# Patient Record
Sex: Female | Born: 1935
Health system: Southern US, Community
[De-identification: ages and names within clinical notes are randomized; demographics above are authoritative.]

## PROBLEM LIST (undated history)

## (undated) DIAGNOSIS — G2581 Restless legs syndrome: Secondary | ICD-10-CM

## (undated) DIAGNOSIS — D696 Thrombocytopenia, unspecified: Secondary | ICD-10-CM

## (undated) DIAGNOSIS — I10 Essential (primary) hypertension: Secondary | ICD-10-CM

## (undated) DIAGNOSIS — Z889 Allergy status to unspecified drugs, medicaments and biological substances status: Secondary | ICD-10-CM

## (undated) DIAGNOSIS — I493 Ventricular premature depolarization: Secondary | ICD-10-CM

## (undated) DIAGNOSIS — E559 Vitamin D deficiency, unspecified: Secondary | ICD-10-CM

## (undated) DIAGNOSIS — R7303 Prediabetes: Secondary | ICD-10-CM

## (undated) DIAGNOSIS — M1990 Unspecified osteoarthritis, unspecified site: Secondary | ICD-10-CM

## (undated) DIAGNOSIS — G4700 Insomnia, unspecified: Secondary | ICD-10-CM

## (undated) DIAGNOSIS — F419 Anxiety disorder, unspecified: Secondary | ICD-10-CM

## (undated) DIAGNOSIS — R42 Dizziness and giddiness: Secondary | ICD-10-CM

## (undated) DIAGNOSIS — H409 Unspecified glaucoma: Secondary | ICD-10-CM

## (undated) DIAGNOSIS — F32A Depression, unspecified: Secondary | ICD-10-CM

## (undated) DIAGNOSIS — F410 Panic disorder [episodic paroxysmal anxiety] without agoraphobia: Secondary | ICD-10-CM

## (undated) DIAGNOSIS — R202 Paresthesia of skin: Secondary | ICD-10-CM

## (undated) DIAGNOSIS — M810 Age-related osteoporosis without current pathological fracture: Secondary | ICD-10-CM

## (undated) DIAGNOSIS — M8580 Other specified disorders of bone density and structure, unspecified site: Secondary | ICD-10-CM

## (undated) DIAGNOSIS — K635 Polyp of colon: Secondary | ICD-10-CM

## (undated) DIAGNOSIS — E785 Hyperlipidemia, unspecified: Secondary | ICD-10-CM

## (undated) DIAGNOSIS — K219 Gastro-esophageal reflux disease without esophagitis: Secondary | ICD-10-CM

## (undated) HISTORY — DX: Thrombocytopenia, unspecified: D69.6

## (undated) HISTORY — DX: Panic disorder (episodic paroxysmal anxiety): F41.0

## (undated) HISTORY — DX: Other specified disorders of bone density and structure, unspecified site: M85.80

## (undated) HISTORY — DX: Vitamin D deficiency, unspecified: E55.9

## (undated) HISTORY — DX: Essential (primary) hypertension: I10

## (undated) HISTORY — DX: Insomnia, unspecified: G47.00

## (undated) HISTORY — DX: Restless legs syndrome: G25.81

## (undated) HISTORY — DX: Allergy status to unspecified drugs, medicaments and biological substances: Z88.9

## (undated) HISTORY — PX: TUBAL LIGATION: SHX77

## (undated) HISTORY — DX: Depression, unspecified: F32.A

## (undated) HISTORY — DX: Gastro-esophageal reflux disease without esophagitis: K21.9

## (undated) HISTORY — PX: APPENDECTOMY: SHX54

## (undated) HISTORY — DX: Anxiety disorder, unspecified: F41.9

## (undated) HISTORY — DX: Polyp of colon: K63.5

## (undated) HISTORY — DX: Dizziness and giddiness: R42

## (undated) HISTORY — PX: OTHER SURGICAL HISTORY: SHX169

## (undated) HISTORY — PX: VAGINAL HYSTERECTOMY: SUR661

## (undated) HISTORY — DX: Ventricular premature depolarization: I49.3

## (undated) HISTORY — DX: Paresthesia of skin: R20.2

## (undated) HISTORY — DX: Unspecified osteoarthritis, unspecified site: M19.90

## (undated) HISTORY — DX: Hyperlipidemia, unspecified: E78.5

## (undated) HISTORY — DX: Prediabetes: R73.03

## (undated) HISTORY — DX: Unspecified glaucoma: H40.9

## (undated) HISTORY — PX: TONSILLECTOMY AND ADENOIDECTOMY: SUR1326

## (undated) HISTORY — PX: FOOT SURGERY: SHX648

## (undated) HISTORY — PX: DILATION AND CURETTAGE OF UTERUS: SHX78

## (undated) HISTORY — DX: Age-related osteoporosis without current pathological fracture: M81.0

## (undated) HISTORY — PX: CATARACT EXTRACTION, BILATERAL: SHX1313

---

## 1998-05-23 ENCOUNTER — Encounter: Payer: Self-pay | Admitting: Family Medicine

## 1998-05-23 ENCOUNTER — Ambulatory Visit (HOSPITAL_COMMUNITY): Admission: RE | Admit: 1998-05-23 | Discharge: 1998-05-23 | Payer: Self-pay | Admitting: Family Medicine

## 1998-06-19 ENCOUNTER — Ambulatory Visit (HOSPITAL_COMMUNITY): Admission: RE | Admit: 1998-06-19 | Discharge: 1998-06-19 | Payer: Self-pay | Admitting: Gastroenterology

## 1998-06-19 ENCOUNTER — Encounter: Payer: Self-pay | Admitting: Gastroenterology

## 1998-11-04 ENCOUNTER — Encounter: Payer: Self-pay | Admitting: Family Medicine

## 1998-11-04 ENCOUNTER — Ambulatory Visit (HOSPITAL_COMMUNITY): Admission: RE | Admit: 1998-11-04 | Discharge: 1998-11-04 | Payer: Self-pay | Admitting: Family Medicine

## 1998-12-12 ENCOUNTER — Encounter: Payer: Self-pay | Admitting: Family Medicine

## 1998-12-12 ENCOUNTER — Ambulatory Visit (HOSPITAL_COMMUNITY): Admission: RE | Admit: 1998-12-12 | Discharge: 1998-12-12 | Payer: Self-pay | Admitting: Family Medicine

## 1999-07-20 ENCOUNTER — Ambulatory Visit (HOSPITAL_COMMUNITY): Admission: RE | Admit: 1999-07-20 | Discharge: 1999-07-20 | Payer: Self-pay | Admitting: Family Medicine

## 1999-07-20 ENCOUNTER — Encounter: Payer: Self-pay | Admitting: Family Medicine

## 2000-08-24 ENCOUNTER — Encounter: Payer: Self-pay | Admitting: Family Medicine

## 2000-08-24 ENCOUNTER — Ambulatory Visit (HOSPITAL_COMMUNITY): Admission: RE | Admit: 2000-08-24 | Discharge: 2000-08-24 | Payer: Self-pay | Admitting: Family Medicine

## 2001-09-12 ENCOUNTER — Ambulatory Visit (HOSPITAL_COMMUNITY): Admission: RE | Admit: 2001-09-12 | Discharge: 2001-09-12 | Payer: Self-pay | Admitting: Family Medicine

## 2001-09-12 ENCOUNTER — Encounter: Payer: Self-pay | Admitting: Family Medicine

## 2001-10-10 ENCOUNTER — Encounter: Payer: Self-pay | Admitting: Family Medicine

## 2001-10-10 ENCOUNTER — Ambulatory Visit (HOSPITAL_COMMUNITY): Admission: RE | Admit: 2001-10-10 | Discharge: 2001-10-10 | Payer: Self-pay | Admitting: Family Medicine

## 2002-09-19 ENCOUNTER — Encounter: Payer: Self-pay | Admitting: Family Medicine

## 2002-09-19 ENCOUNTER — Ambulatory Visit (HOSPITAL_COMMUNITY): Admission: RE | Admit: 2002-09-19 | Discharge: 2002-09-19 | Payer: Self-pay | Admitting: Family Medicine

## 2003-10-03 ENCOUNTER — Ambulatory Visit (HOSPITAL_COMMUNITY): Admission: RE | Admit: 2003-10-03 | Discharge: 2003-10-03 | Payer: Self-pay | Admitting: Family Medicine

## 2004-01-14 ENCOUNTER — Ambulatory Visit (HOSPITAL_COMMUNITY): Admission: RE | Admit: 2004-01-14 | Discharge: 2004-01-14 | Payer: Self-pay | Admitting: Family Medicine

## 2004-07-22 ENCOUNTER — Other Ambulatory Visit: Admission: RE | Admit: 2004-07-22 | Discharge: 2004-07-22 | Payer: Self-pay | Admitting: Family Medicine

## 2004-11-04 ENCOUNTER — Ambulatory Visit (HOSPITAL_COMMUNITY): Admission: RE | Admit: 2004-11-04 | Discharge: 2004-11-04 | Payer: Self-pay | Admitting: Family Medicine

## 2005-07-28 ENCOUNTER — Other Ambulatory Visit: Admission: RE | Admit: 2005-07-28 | Discharge: 2005-07-28 | Payer: Self-pay | Admitting: Family Medicine

## 2005-11-08 ENCOUNTER — Ambulatory Visit (HOSPITAL_COMMUNITY): Admission: RE | Admit: 2005-11-08 | Discharge: 2005-11-08 | Payer: Self-pay | Admitting: Family Medicine

## 2006-08-01 ENCOUNTER — Other Ambulatory Visit: Admission: RE | Admit: 2006-08-01 | Discharge: 2006-08-01 | Payer: Self-pay | Admitting: Family Medicine

## 2006-11-29 ENCOUNTER — Ambulatory Visit (HOSPITAL_COMMUNITY): Admission: RE | Admit: 2006-11-29 | Discharge: 2006-11-29 | Payer: Self-pay | Admitting: Family Medicine

## 2007-12-01 ENCOUNTER — Ambulatory Visit (HOSPITAL_COMMUNITY): Admission: RE | Admit: 2007-12-01 | Discharge: 2007-12-01 | Payer: Self-pay | Admitting: Family Medicine

## 2008-12-02 ENCOUNTER — Ambulatory Visit (HOSPITAL_COMMUNITY): Admission: RE | Admit: 2008-12-02 | Discharge: 2008-12-02 | Payer: Self-pay | Admitting: Family Medicine

## 2009-03-27 ENCOUNTER — Encounter: Admission: RE | Admit: 2009-03-27 | Discharge: 2009-03-27 | Payer: Self-pay | Admitting: Family Medicine

## 2009-12-03 ENCOUNTER — Ambulatory Visit (HOSPITAL_COMMUNITY): Admission: RE | Admit: 2009-12-03 | Discharge: 2009-12-03 | Payer: Self-pay | Admitting: Family Medicine

## 2010-11-05 ENCOUNTER — Other Ambulatory Visit (HOSPITAL_COMMUNITY): Payer: Self-pay | Admitting: Family Medicine

## 2010-11-05 DIAGNOSIS — Z1231 Encounter for screening mammogram for malignant neoplasm of breast: Secondary | ICD-10-CM

## 2010-12-23 ENCOUNTER — Ambulatory Visit (HOSPITAL_COMMUNITY)
Admission: RE | Admit: 2010-12-23 | Discharge: 2010-12-23 | Disposition: A | Discharge status: Home / Self Care | Payer: Medicare Other | Source: Ambulatory Visit | Attending: Family Medicine | Admitting: Family Medicine

## 2010-12-23 DIAGNOSIS — Z1231 Encounter for screening mammogram for malignant neoplasm of breast: Secondary | ICD-10-CM

## 2010-12-28 ENCOUNTER — Other Ambulatory Visit: Payer: Self-pay | Admitting: Family Medicine

## 2010-12-28 DIAGNOSIS — R928 Other abnormal and inconclusive findings on diagnostic imaging of breast: Secondary | ICD-10-CM

## 2011-01-04 ENCOUNTER — Ambulatory Visit
Admission: RE | Admit: 2011-01-04 | Discharge: 2011-01-04 | Disposition: A | Discharge status: Home / Self Care | Payer: Medicare Other | Source: Ambulatory Visit | Attending: Family Medicine | Admitting: Family Medicine

## 2011-01-04 DIAGNOSIS — R928 Other abnormal and inconclusive findings on diagnostic imaging of breast: Secondary | ICD-10-CM

## 2011-07-07 ENCOUNTER — Other Ambulatory Visit: Payer: Self-pay | Admitting: Diagnostic Neuroimaging

## 2011-07-07 DIAGNOSIS — R42 Dizziness and giddiness: Secondary | ICD-10-CM

## 2011-07-15 ENCOUNTER — Ambulatory Visit
Admission: RE | Admit: 2011-07-15 | Discharge: 2011-07-15 | Disposition: A | Discharge status: Home / Self Care | Payer: Medicare Other | Source: Ambulatory Visit | Attending: Diagnostic Neuroimaging | Admitting: Diagnostic Neuroimaging

## 2011-07-15 DIAGNOSIS — R42 Dizziness and giddiness: Secondary | ICD-10-CM

## 2011-07-15 MED ORDER — GADOBENATE DIMEGLUMINE 529 MG/ML IV SOLN
14.0000 mL | Freq: Once | INTRAVENOUS | Status: AC | PRN
  Administered 2011-07-15: 14 mL via INTRAVENOUS

## 2011-07-22 ENCOUNTER — Other Ambulatory Visit: Payer: Self-pay | Admitting: Diagnostic Neuroimaging

## 2011-07-22 DIAGNOSIS — R42 Dizziness and giddiness: Secondary | ICD-10-CM

## 2011-07-23 ENCOUNTER — Ambulatory Visit
Admission: RE | Admit: 2011-07-23 | Discharge: 2011-07-23 | Disposition: A | Discharge status: Home / Self Care | Payer: Medicare Other | Source: Ambulatory Visit | Attending: Diagnostic Neuroimaging | Admitting: Diagnostic Neuroimaging

## 2011-07-23 DIAGNOSIS — R42 Dizziness and giddiness: Secondary | ICD-10-CM

## 2011-07-24 ENCOUNTER — Other Ambulatory Visit: Payer: Medicare Other

## 2011-07-25 ENCOUNTER — Other Ambulatory Visit: Payer: Medicare Other

## 2012-01-25 ENCOUNTER — Other Ambulatory Visit: Payer: Self-pay | Admitting: Family Medicine

## 2012-01-25 DIAGNOSIS — Z1231 Encounter for screening mammogram for malignant neoplasm of breast: Secondary | ICD-10-CM

## 2012-02-09 ENCOUNTER — Ambulatory Visit
Admission: RE | Admit: 2012-02-09 | Discharge: 2012-02-09 | Disposition: A | Discharge status: Home / Self Care | Payer: Medicare Other | Source: Ambulatory Visit | Attending: Family Medicine | Admitting: Family Medicine

## 2012-02-09 DIAGNOSIS — Z1231 Encounter for screening mammogram for malignant neoplasm of breast: Secondary | ICD-10-CM

## 2013-01-31 ENCOUNTER — Other Ambulatory Visit: Payer: Self-pay

## 2013-01-31 DIAGNOSIS — Z1231 Encounter for screening mammogram for malignant neoplasm of breast: Secondary | ICD-10-CM

## 2013-02-15 ENCOUNTER — Ambulatory Visit
Admission: RE | Admit: 2013-02-15 | Discharge: 2013-02-15 | Disposition: A | Discharge status: Home / Self Care | Payer: Medicare Other | Source: Ambulatory Visit

## 2013-02-15 DIAGNOSIS — Z1231 Encounter for screening mammogram for malignant neoplasm of breast: Secondary | ICD-10-CM

## 2013-02-19 ENCOUNTER — Other Ambulatory Visit: Payer: Self-pay | Admitting: Family Medicine

## 2013-02-19 DIAGNOSIS — R928 Other abnormal and inconclusive findings on diagnostic imaging of breast: Secondary | ICD-10-CM

## 2013-02-21 ENCOUNTER — Ambulatory Visit
Admission: RE | Admit: 2013-02-21 | Discharge: 2013-02-21 | Disposition: A | Discharge status: Home / Self Care | Payer: Medicare Other | Source: Ambulatory Visit | Attending: Family Medicine | Admitting: Family Medicine

## 2013-02-21 DIAGNOSIS — R928 Other abnormal and inconclusive findings on diagnostic imaging of breast: Secondary | ICD-10-CM

## 2013-02-23 ENCOUNTER — Other Ambulatory Visit: Payer: Medicare Other

## 2013-02-26 ENCOUNTER — Ambulatory Visit
Admission: RE | Admit: 2013-02-26 | Discharge: 2013-02-26 | Disposition: A | Discharge status: Home / Self Care | Payer: Medicare Other | Source: Ambulatory Visit | Attending: Family Medicine | Admitting: Family Medicine

## 2013-02-26 ENCOUNTER — Other Ambulatory Visit: Payer: Self-pay | Admitting: Family Medicine

## 2013-02-26 DIAGNOSIS — M25562 Pain in left knee: Secondary | ICD-10-CM

## 2013-07-17 ENCOUNTER — Other Ambulatory Visit: Payer: Self-pay | Admitting: Family Medicine

## 2013-07-17 DIAGNOSIS — N6009 Solitary cyst of unspecified breast: Secondary | ICD-10-CM

## 2013-07-18 ENCOUNTER — Other Ambulatory Visit: Payer: Self-pay | Admitting: Orthopaedic Surgery

## 2013-07-18 DIAGNOSIS — M25551 Pain in right hip: Secondary | ICD-10-CM

## 2013-07-27 ENCOUNTER — Ambulatory Visit
Admission: RE | Admit: 2013-07-27 | Discharge: 2013-07-27 | Disposition: A | Discharge status: Home / Self Care | Payer: Medicare Other | Source: Ambulatory Visit | Attending: Orthopaedic Surgery | Admitting: Orthopaedic Surgery

## 2013-07-27 DIAGNOSIS — M25551 Pain in right hip: Secondary | ICD-10-CM

## 2013-08-23 ENCOUNTER — Ambulatory Visit
Admission: RE | Admit: 2013-08-23 | Discharge: 2013-08-23 | Disposition: A | Discharge status: Home / Self Care | Payer: Medicare Other | Source: Ambulatory Visit | Attending: Family Medicine | Admitting: Family Medicine

## 2013-08-23 DIAGNOSIS — N6009 Solitary cyst of unspecified breast: Secondary | ICD-10-CM

## 2014-01-11 ENCOUNTER — Other Ambulatory Visit: Payer: Self-pay

## 2014-01-11 DIAGNOSIS — Z1231 Encounter for screening mammogram for malignant neoplasm of breast: Secondary | ICD-10-CM

## 2014-02-20 ENCOUNTER — Ambulatory Visit
Admission: RE | Admit: 2014-02-20 | Discharge: 2014-02-20 | Disposition: A | Discharge status: Home / Self Care | Payer: Medicare Other | Source: Ambulatory Visit

## 2014-02-20 ENCOUNTER — Encounter (INDEPENDENT_AMBULATORY_CARE_PROVIDER_SITE_OTHER): Payer: Self-pay

## 2014-02-20 DIAGNOSIS — Z1231 Encounter for screening mammogram for malignant neoplasm of breast: Secondary | ICD-10-CM

## 2015-01-20 ENCOUNTER — Other Ambulatory Visit: Payer: Self-pay

## 2015-01-20 DIAGNOSIS — Z1231 Encounter for screening mammogram for malignant neoplasm of breast: Secondary | ICD-10-CM

## 2015-02-26 ENCOUNTER — Ambulatory Visit
Admission: RE | Admit: 2015-02-26 | Discharge: 2015-02-26 | Disposition: A | Discharge status: Home / Self Care | Payer: Medicare Other | Source: Ambulatory Visit

## 2015-02-26 DIAGNOSIS — Z1231 Encounter for screening mammogram for malignant neoplasm of breast: Secondary | ICD-10-CM

## 2015-07-31 DIAGNOSIS — R739 Hyperglycemia, unspecified: Secondary | ICD-10-CM | POA: Diagnosis not present

## 2015-07-31 DIAGNOSIS — E7800 Pure hypercholesterolemia, unspecified: Secondary | ICD-10-CM | POA: Diagnosis not present

## 2015-07-31 DIAGNOSIS — K219 Gastro-esophageal reflux disease without esophagitis: Secondary | ICD-10-CM | POA: Diagnosis not present

## 2015-07-31 DIAGNOSIS — I10 Essential (primary) hypertension: Secondary | ICD-10-CM | POA: Diagnosis not present

## 2015-07-31 DIAGNOSIS — M62838 Other muscle spasm: Secondary | ICD-10-CM | POA: Diagnosis not present

## 2015-07-31 DIAGNOSIS — G4700 Insomnia, unspecified: Secondary | ICD-10-CM | POA: Diagnosis not present

## 2015-08-25 DIAGNOSIS — H401212 Low-tension glaucoma, right eye, moderate stage: Secondary | ICD-10-CM | POA: Diagnosis not present

## 2015-08-25 DIAGNOSIS — H01002 Unspecified blepharitis right lower eyelid: Secondary | ICD-10-CM | POA: Diagnosis not present

## 2015-08-25 DIAGNOSIS — H43813 Vitreous degeneration, bilateral: Secondary | ICD-10-CM | POA: Diagnosis not present

## 2015-08-25 DIAGNOSIS — H401224 Low-tension glaucoma, left eye, indeterminate stage: Secondary | ICD-10-CM | POA: Diagnosis not present

## 2015-11-04 DIAGNOSIS — R202 Paresthesia of skin: Secondary | ICD-10-CM | POA: Diagnosis not present

## 2015-11-05 ENCOUNTER — Other Ambulatory Visit: Payer: Self-pay | Admitting: Family Medicine

## 2015-11-05 DIAGNOSIS — R202 Paresthesia of skin: Secondary | ICD-10-CM

## 2015-11-10 ENCOUNTER — Ambulatory Visit
Admission: RE | Admit: 2015-11-10 | Discharge: 2015-11-10 | Disposition: A | Discharge status: Home / Self Care | Payer: Medicare Other | Source: Ambulatory Visit | Attending: Family Medicine | Admitting: Family Medicine

## 2015-11-10 DIAGNOSIS — R202 Paresthesia of skin: Secondary | ICD-10-CM

## 2015-11-10 DIAGNOSIS — R42 Dizziness and giddiness: Secondary | ICD-10-CM | POA: Diagnosis not present

## 2016-01-16 ENCOUNTER — Other Ambulatory Visit: Payer: Self-pay | Admitting: Family Medicine

## 2016-01-16 DIAGNOSIS — Z1231 Encounter for screening mammogram for malignant neoplasm of breast: Secondary | ICD-10-CM

## 2016-01-29 DIAGNOSIS — I10 Essential (primary) hypertension: Secondary | ICD-10-CM | POA: Diagnosis not present

## 2016-01-29 DIAGNOSIS — E559 Vitamin D deficiency, unspecified: Secondary | ICD-10-CM | POA: Diagnosis not present

## 2016-01-29 DIAGNOSIS — Z0000 Encounter for general adult medical examination without abnormal findings: Secondary | ICD-10-CM | POA: Diagnosis not present

## 2016-01-29 DIAGNOSIS — D696 Thrombocytopenia, unspecified: Secondary | ICD-10-CM | POA: Diagnosis not present

## 2016-01-29 DIAGNOSIS — K219 Gastro-esophageal reflux disease without esophagitis: Secondary | ICD-10-CM | POA: Diagnosis not present

## 2016-01-29 DIAGNOSIS — K5900 Constipation, unspecified: Secondary | ICD-10-CM | POA: Diagnosis not present

## 2016-01-29 DIAGNOSIS — R7303 Prediabetes: Secondary | ICD-10-CM | POA: Diagnosis not present

## 2016-01-29 DIAGNOSIS — G4700 Insomnia, unspecified: Secondary | ICD-10-CM | POA: Diagnosis not present

## 2016-01-29 DIAGNOSIS — R0789 Other chest pain: Secondary | ICD-10-CM | POA: Diagnosis not present

## 2016-01-29 DIAGNOSIS — E7800 Pure hypercholesterolemia, unspecified: Secondary | ICD-10-CM | POA: Diagnosis not present

## 2016-02-18 DIAGNOSIS — Z808 Family history of malignant neoplasm of other organs or systems: Secondary | ICD-10-CM | POA: Diagnosis not present

## 2016-02-18 DIAGNOSIS — L821 Other seborrheic keratosis: Secondary | ICD-10-CM | POA: Diagnosis not present

## 2016-02-18 DIAGNOSIS — L708 Other acne: Secondary | ICD-10-CM | POA: Diagnosis not present

## 2016-02-18 DIAGNOSIS — D2230 Melanocytic nevi of unspecified part of face: Secondary | ICD-10-CM | POA: Diagnosis not present

## 2016-02-18 DIAGNOSIS — L570 Actinic keratosis: Secondary | ICD-10-CM | POA: Diagnosis not present

## 2016-02-25 DIAGNOSIS — H401221 Low-tension glaucoma, left eye, mild stage: Secondary | ICD-10-CM | POA: Diagnosis not present

## 2016-02-25 DIAGNOSIS — H401212 Low-tension glaucoma, right eye, moderate stage: Secondary | ICD-10-CM | POA: Diagnosis not present

## 2016-03-02 ENCOUNTER — Ambulatory Visit
Admission: RE | Admit: 2016-03-02 | Discharge: 2016-03-02 | Disposition: A | Discharge status: Home / Self Care | Payer: Medicare Other | Source: Ambulatory Visit | Attending: Family Medicine | Admitting: Family Medicine

## 2016-03-02 DIAGNOSIS — Z1231 Encounter for screening mammogram for malignant neoplasm of breast: Secondary | ICD-10-CM | POA: Diagnosis not present

## 2016-05-21 DIAGNOSIS — B9789 Other viral agents as the cause of diseases classified elsewhere: Secondary | ICD-10-CM | POA: Diagnosis not present

## 2016-05-21 DIAGNOSIS — J069 Acute upper respiratory infection, unspecified: Secondary | ICD-10-CM | POA: Diagnosis not present

## 2016-07-12 DIAGNOSIS — E559 Vitamin D deficiency, unspecified: Secondary | ICD-10-CM | POA: Diagnosis not present

## 2016-07-12 DIAGNOSIS — G4489 Other headache syndrome: Secondary | ICD-10-CM | POA: Diagnosis not present

## 2016-07-12 DIAGNOSIS — K219 Gastro-esophageal reflux disease without esophagitis: Secondary | ICD-10-CM | POA: Diagnosis not present

## 2016-07-12 DIAGNOSIS — I10 Essential (primary) hypertension: Secondary | ICD-10-CM | POA: Diagnosis not present

## 2016-07-30 DIAGNOSIS — E7800 Pure hypercholesterolemia, unspecified: Secondary | ICD-10-CM | POA: Diagnosis not present

## 2016-07-30 DIAGNOSIS — K219 Gastro-esophageal reflux disease without esophagitis: Secondary | ICD-10-CM | POA: Diagnosis not present

## 2016-07-30 DIAGNOSIS — R7303 Prediabetes: Secondary | ICD-10-CM | POA: Diagnosis not present

## 2016-07-30 DIAGNOSIS — D696 Thrombocytopenia, unspecified: Secondary | ICD-10-CM | POA: Diagnosis not present

## 2016-09-17 DIAGNOSIS — H401221 Low-tension glaucoma, left eye, mild stage: Secondary | ICD-10-CM | POA: Diagnosis not present

## 2016-09-17 DIAGNOSIS — H401212 Low-tension glaucoma, right eye, moderate stage: Secondary | ICD-10-CM | POA: Diagnosis not present

## 2016-09-17 DIAGNOSIS — H04123 Dry eye syndrome of bilateral lacrimal glands: Secondary | ICD-10-CM | POA: Diagnosis not present

## 2016-10-05 DIAGNOSIS — H01004 Unspecified blepharitis left upper eyelid: Secondary | ICD-10-CM | POA: Diagnosis not present

## 2016-10-05 DIAGNOSIS — H02055 Trichiasis without entropian left lower eyelid: Secondary | ICD-10-CM | POA: Diagnosis not present

## 2016-10-05 DIAGNOSIS — H01005 Unspecified blepharitis left lower eyelid: Secondary | ICD-10-CM | POA: Diagnosis not present

## 2016-10-14 DIAGNOSIS — S82842A Displaced bimalleolar fracture of left lower leg, initial encounter for closed fracture: Secondary | ICD-10-CM | POA: Diagnosis not present

## 2016-10-14 DIAGNOSIS — S82841A Displaced bimalleolar fracture of right lower leg, initial encounter for closed fracture: Secondary | ICD-10-CM | POA: Diagnosis not present

## 2016-10-14 DIAGNOSIS — M25572 Pain in left ankle and joints of left foot: Secondary | ICD-10-CM | POA: Diagnosis not present

## 2016-10-14 DIAGNOSIS — M1811 Unilateral primary osteoarthritis of first carpometacarpal joint, right hand: Secondary | ICD-10-CM | POA: Diagnosis not present

## 2016-10-15 DIAGNOSIS — G8918 Other acute postprocedural pain: Secondary | ICD-10-CM | POA: Diagnosis not present

## 2016-10-15 DIAGNOSIS — S82852A Displaced trimalleolar fracture of left lower leg, initial encounter for closed fracture: Secondary | ICD-10-CM | POA: Diagnosis not present

## 2016-10-26 DIAGNOSIS — M1611 Unilateral primary osteoarthritis, right hip: Secondary | ICD-10-CM | POA: Diagnosis not present

## 2016-10-26 DIAGNOSIS — Z9181 History of falling: Secondary | ICD-10-CM | POA: Diagnosis not present

## 2016-10-26 DIAGNOSIS — M810 Age-related osteoporosis without current pathological fracture: Secondary | ICD-10-CM | POA: Diagnosis not present

## 2016-10-26 DIAGNOSIS — M1811 Unilateral primary osteoarthritis of first carpometacarpal joint, right hand: Secondary | ICD-10-CM | POA: Diagnosis not present

## 2016-10-26 DIAGNOSIS — S82852D Displaced trimalleolar fracture of left lower leg, subsequent encounter for closed fracture with routine healing: Secondary | ICD-10-CM | POA: Diagnosis not present

## 2016-10-28 DIAGNOSIS — S82852D Displaced trimalleolar fracture of left lower leg, subsequent encounter for closed fracture with routine healing: Secondary | ICD-10-CM | POA: Diagnosis not present

## 2016-11-09 DIAGNOSIS — M8580 Other specified disorders of bone density and structure, unspecified site: Secondary | ICD-10-CM | POA: Diagnosis not present

## 2016-11-09 DIAGNOSIS — M169 Osteoarthritis of hip, unspecified: Secondary | ICD-10-CM | POA: Diagnosis not present

## 2016-11-18 DIAGNOSIS — S82852D Displaced trimalleolar fracture of left lower leg, subsequent encounter for closed fracture with routine healing: Secondary | ICD-10-CM | POA: Diagnosis not present

## 2016-11-30 DIAGNOSIS — S82852D Displaced trimalleolar fracture of left lower leg, subsequent encounter for closed fracture with routine healing: Secondary | ICD-10-CM | POA: Diagnosis not present

## 2016-12-09 DIAGNOSIS — M8580 Other specified disorders of bone density and structure, unspecified site: Secondary | ICD-10-CM | POA: Diagnosis not present

## 2016-12-09 DIAGNOSIS — M169 Osteoarthritis of hip, unspecified: Secondary | ICD-10-CM | POA: Diagnosis not present

## 2016-12-14 DIAGNOSIS — M6281 Muscle weakness (generalized): Secondary | ICD-10-CM | POA: Diagnosis not present

## 2016-12-14 DIAGNOSIS — H8110 Benign paroxysmal vertigo, unspecified ear: Secondary | ICD-10-CM | POA: Diagnosis not present

## 2016-12-14 DIAGNOSIS — D709 Neutropenia, unspecified: Secondary | ICD-10-CM | POA: Diagnosis not present

## 2016-12-14 DIAGNOSIS — R7303 Prediabetes: Secondary | ICD-10-CM | POA: Diagnosis not present

## 2016-12-14 DIAGNOSIS — R2689 Other abnormalities of gait and mobility: Secondary | ICD-10-CM | POA: Diagnosis not present

## 2016-12-14 DIAGNOSIS — R112 Nausea with vomiting, unspecified: Secondary | ICD-10-CM | POA: Diagnosis not present

## 2016-12-14 DIAGNOSIS — I10 Essential (primary) hypertension: Secondary | ICD-10-CM | POA: Diagnosis not present

## 2016-12-14 DIAGNOSIS — Z9889 Other specified postprocedural states: Secondary | ICD-10-CM | POA: Diagnosis not present

## 2016-12-14 DIAGNOSIS — D696 Thrombocytopenia, unspecified: Secondary | ICD-10-CM | POA: Diagnosis not present

## 2016-12-14 DIAGNOSIS — R42 Dizziness and giddiness: Secondary | ICD-10-CM | POA: Diagnosis not present

## 2016-12-14 DIAGNOSIS — R110 Nausea: Secondary | ICD-10-CM | POA: Diagnosis not present

## 2016-12-15 DIAGNOSIS — Z967 Presence of other bone and tendon implants: Secondary | ICD-10-CM | POA: Diagnosis not present

## 2016-12-15 DIAGNOSIS — R42 Dizziness and giddiness: Secondary | ICD-10-CM | POA: Diagnosis not present

## 2016-12-15 DIAGNOSIS — R110 Nausea: Secondary | ICD-10-CM | POA: Diagnosis not present

## 2016-12-15 DIAGNOSIS — I10 Essential (primary) hypertension: Secondary | ICD-10-CM | POA: Diagnosis not present

## 2016-12-16 DIAGNOSIS — R110 Nausea: Secondary | ICD-10-CM | POA: Diagnosis not present

## 2016-12-16 DIAGNOSIS — R42 Dizziness and giddiness: Secondary | ICD-10-CM | POA: Diagnosis not present

## 2016-12-17 DIAGNOSIS — R55 Syncope and collapse: Secondary | ICD-10-CM | POA: Diagnosis not present

## 2016-12-17 DIAGNOSIS — S8290XD Unspecified fracture of unspecified lower leg, subsequent encounter for closed fracture with routine healing: Secondary | ICD-10-CM | POA: Diagnosis not present

## 2016-12-17 DIAGNOSIS — R42 Dizziness and giddiness: Secondary | ICD-10-CM | POA: Diagnosis not present

## 2016-12-17 DIAGNOSIS — N3281 Overactive bladder: Secondary | ICD-10-CM | POA: Diagnosis not present

## 2016-12-17 DIAGNOSIS — Z9889 Other specified postprocedural states: Secondary | ICD-10-CM | POA: Diagnosis not present

## 2016-12-17 DIAGNOSIS — I10 Essential (primary) hypertension: Secondary | ICD-10-CM | POA: Diagnosis not present

## 2016-12-17 DIAGNOSIS — D709 Neutropenia, unspecified: Secondary | ICD-10-CM | POA: Diagnosis not present

## 2016-12-17 DIAGNOSIS — Z9181 History of falling: Secondary | ICD-10-CM | POA: Diagnosis not present

## 2016-12-17 DIAGNOSIS — H8110 Benign paroxysmal vertigo, unspecified ear: Secondary | ICD-10-CM | POA: Diagnosis not present

## 2016-12-17 DIAGNOSIS — R7303 Prediabetes: Secondary | ICD-10-CM | POA: Diagnosis not present

## 2016-12-17 DIAGNOSIS — R2689 Other abnormalities of gait and mobility: Secondary | ICD-10-CM | POA: Diagnosis not present

## 2016-12-17 DIAGNOSIS — R350 Frequency of micturition: Secondary | ICD-10-CM | POA: Diagnosis not present

## 2016-12-17 DIAGNOSIS — M6281 Muscle weakness (generalized): Secondary | ICD-10-CM | POA: Diagnosis not present

## 2016-12-17 DIAGNOSIS — R3915 Urgency of urination: Secondary | ICD-10-CM | POA: Diagnosis not present

## 2016-12-17 DIAGNOSIS — D696 Thrombocytopenia, unspecified: Secondary | ICD-10-CM | POA: Diagnosis not present

## 2016-12-17 DIAGNOSIS — E785 Hyperlipidemia, unspecified: Secondary | ICD-10-CM | POA: Diagnosis not present

## 2016-12-17 DIAGNOSIS — R110 Nausea: Secondary | ICD-10-CM | POA: Diagnosis not present

## 2016-12-17 DIAGNOSIS — F339 Major depressive disorder, recurrent, unspecified: Secondary | ICD-10-CM | POA: Diagnosis not present

## 2016-12-17 DIAGNOSIS — K219 Gastro-esophageal reflux disease without esophagitis: Secondary | ICD-10-CM | POA: Diagnosis not present

## 2016-12-20 DIAGNOSIS — F339 Major depressive disorder, recurrent, unspecified: Secondary | ICD-10-CM | POA: Diagnosis not present

## 2016-12-20 DIAGNOSIS — M6281 Muscle weakness (generalized): Secondary | ICD-10-CM | POA: Diagnosis not present

## 2016-12-20 DIAGNOSIS — K219 Gastro-esophageal reflux disease without esophagitis: Secondary | ICD-10-CM | POA: Diagnosis not present

## 2016-12-20 DIAGNOSIS — H8110 Benign paroxysmal vertigo, unspecified ear: Secondary | ICD-10-CM | POA: Diagnosis not present

## 2016-12-30 DIAGNOSIS — N3281 Overactive bladder: Secondary | ICD-10-CM | POA: Diagnosis not present

## 2016-12-30 DIAGNOSIS — R3915 Urgency of urination: Secondary | ICD-10-CM | POA: Diagnosis not present

## 2016-12-30 DIAGNOSIS — R350 Frequency of micturition: Secondary | ICD-10-CM | POA: Diagnosis not present

## 2017-01-09 DIAGNOSIS — H8110 Benign paroxysmal vertigo, unspecified ear: Secondary | ICD-10-CM | POA: Diagnosis not present

## 2017-01-09 DIAGNOSIS — M810 Age-related osteoporosis without current pathological fracture: Secondary | ICD-10-CM | POA: Diagnosis not present

## 2017-01-09 DIAGNOSIS — M8580 Other specified disorders of bone density and structure, unspecified site: Secondary | ICD-10-CM | POA: Diagnosis not present

## 2017-01-09 DIAGNOSIS — M169 Osteoarthritis of hip, unspecified: Secondary | ICD-10-CM | POA: Diagnosis not present

## 2017-01-09 DIAGNOSIS — M1990 Unspecified osteoarthritis, unspecified site: Secondary | ICD-10-CM | POA: Diagnosis not present

## 2017-01-09 DIAGNOSIS — H409 Unspecified glaucoma: Secondary | ICD-10-CM | POA: Diagnosis not present

## 2017-01-09 DIAGNOSIS — S8291XD Unspecified fracture of right lower leg, subsequent encounter for closed fracture with routine healing: Secondary | ICD-10-CM | POA: Diagnosis not present

## 2017-01-09 DIAGNOSIS — S8292XD Unspecified fracture of left lower leg, subsequent encounter for closed fracture with routine healing: Secondary | ICD-10-CM | POA: Diagnosis not present

## 2017-01-11 DIAGNOSIS — H401212 Low-tension glaucoma, right eye, moderate stage: Secondary | ICD-10-CM | POA: Diagnosis not present

## 2017-01-11 DIAGNOSIS — H401221 Low-tension glaucoma, left eye, mild stage: Secondary | ICD-10-CM | POA: Diagnosis not present

## 2017-01-13 DIAGNOSIS — Z9889 Other specified postprocedural states: Secondary | ICD-10-CM | POA: Diagnosis not present

## 2017-01-13 DIAGNOSIS — S82852D Displaced trimalleolar fracture of left lower leg, subsequent encounter for closed fracture with routine healing: Secondary | ICD-10-CM | POA: Diagnosis not present

## 2017-01-19 ENCOUNTER — Other Ambulatory Visit: Payer: Self-pay | Admitting: Family Medicine

## 2017-01-19 DIAGNOSIS — Z1231 Encounter for screening mammogram for malignant neoplasm of breast: Secondary | ICD-10-CM

## 2017-01-31 DIAGNOSIS — H8113 Benign paroxysmal vertigo, bilateral: Secondary | ICD-10-CM | POA: Diagnosis not present

## 2017-01-31 DIAGNOSIS — S82402D Unspecified fracture of shaft of left fibula, subsequent encounter for closed fracture with routine healing: Secondary | ICD-10-CM | POA: Diagnosis not present

## 2017-01-31 DIAGNOSIS — R7303 Prediabetes: Secondary | ICD-10-CM | POA: Diagnosis not present

## 2017-01-31 DIAGNOSIS — S82202D Unspecified fracture of shaft of left tibia, subsequent encounter for closed fracture with routine healing: Secondary | ICD-10-CM | POA: Diagnosis not present

## 2017-02-03 DIAGNOSIS — H60332 Swimmer's ear, left ear: Secondary | ICD-10-CM | POA: Diagnosis not present

## 2017-02-03 DIAGNOSIS — Z0000 Encounter for general adult medical examination without abnormal findings: Secondary | ICD-10-CM | POA: Diagnosis not present

## 2017-02-03 DIAGNOSIS — E7800 Pure hypercholesterolemia, unspecified: Secondary | ICD-10-CM | POA: Diagnosis not present

## 2017-02-03 DIAGNOSIS — R7303 Prediabetes: Secondary | ICD-10-CM | POA: Diagnosis not present

## 2017-02-09 DIAGNOSIS — M169 Osteoarthritis of hip, unspecified: Secondary | ICD-10-CM | POA: Diagnosis not present

## 2017-02-09 DIAGNOSIS — M8580 Other specified disorders of bone density and structure, unspecified site: Secondary | ICD-10-CM | POA: Diagnosis not present

## 2017-03-08 ENCOUNTER — Ambulatory Visit
Admission: RE | Admit: 2017-03-08 | Discharge: 2017-03-08 | Disposition: A | Discharge status: Home / Self Care | Payer: Medicare Other | Source: Ambulatory Visit | Attending: Family Medicine | Admitting: Family Medicine

## 2017-03-08 DIAGNOSIS — M25572 Pain in left ankle and joints of left foot: Secondary | ICD-10-CM | POA: Diagnosis not present

## 2017-03-08 DIAGNOSIS — Z1231 Encounter for screening mammogram for malignant neoplasm of breast: Secondary | ICD-10-CM

## 2017-03-11 DIAGNOSIS — M8580 Other specified disorders of bone density and structure, unspecified site: Secondary | ICD-10-CM | POA: Diagnosis not present

## 2017-03-11 DIAGNOSIS — M169 Osteoarthritis of hip, unspecified: Secondary | ICD-10-CM | POA: Diagnosis not present

## 2017-03-14 DIAGNOSIS — M1990 Unspecified osteoarthritis, unspecified site: Secondary | ICD-10-CM | POA: Diagnosis not present

## 2017-03-14 DIAGNOSIS — H8110 Benign paroxysmal vertigo, unspecified ear: Secondary | ICD-10-CM | POA: Diagnosis not present

## 2017-03-14 DIAGNOSIS — H409 Unspecified glaucoma: Secondary | ICD-10-CM | POA: Diagnosis not present

## 2017-03-14 DIAGNOSIS — S8291XD Unspecified fracture of right lower leg, subsequent encounter for closed fracture with routine healing: Secondary | ICD-10-CM | POA: Diagnosis not present

## 2017-03-14 DIAGNOSIS — M810 Age-related osteoporosis without current pathological fracture: Secondary | ICD-10-CM | POA: Diagnosis not present

## 2017-03-14 DIAGNOSIS — S8292XD Unspecified fracture of left lower leg, subsequent encounter for closed fracture with routine healing: Secondary | ICD-10-CM | POA: Diagnosis not present

## 2017-03-23 DIAGNOSIS — L821 Other seborrheic keratosis: Secondary | ICD-10-CM | POA: Diagnosis not present

## 2017-03-23 DIAGNOSIS — Z23 Encounter for immunization: Secondary | ICD-10-CM | POA: Diagnosis not present

## 2017-03-23 DIAGNOSIS — L570 Actinic keratosis: Secondary | ICD-10-CM | POA: Diagnosis not present

## 2017-04-11 DIAGNOSIS — M8580 Other specified disorders of bone density and structure, unspecified site: Secondary | ICD-10-CM | POA: Diagnosis not present

## 2017-04-11 DIAGNOSIS — M169 Osteoarthritis of hip, unspecified: Secondary | ICD-10-CM | POA: Diagnosis not present

## 2017-04-14 DIAGNOSIS — M25572 Pain in left ankle and joints of left foot: Secondary | ICD-10-CM | POA: Diagnosis not present

## 2017-05-11 DIAGNOSIS — M169 Osteoarthritis of hip, unspecified: Secondary | ICD-10-CM | POA: Diagnosis not present

## 2017-05-11 DIAGNOSIS — M8580 Other specified disorders of bone density and structure, unspecified site: Secondary | ICD-10-CM | POA: Diagnosis not present

## 2017-05-24 DIAGNOSIS — M4696 Unspecified inflammatory spondylopathy, lumbar region: Secondary | ICD-10-CM | POA: Diagnosis not present

## 2017-05-24 DIAGNOSIS — M25512 Pain in left shoulder: Secondary | ICD-10-CM | POA: Diagnosis not present

## 2017-05-24 DIAGNOSIS — M25572 Pain in left ankle and joints of left foot: Secondary | ICD-10-CM | POA: Diagnosis not present

## 2017-06-02 DIAGNOSIS — M8588 Other specified disorders of bone density and structure, other site: Secondary | ICD-10-CM | POA: Diagnosis not present

## 2017-06-02 DIAGNOSIS — M810 Age-related osteoporosis without current pathological fracture: Secondary | ICD-10-CM | POA: Diagnosis not present

## 2017-06-03 ENCOUNTER — Encounter (HOSPITAL_COMMUNITY): Payer: Medicare Other

## 2017-06-08 ENCOUNTER — Ambulatory Visit (HOSPITAL_COMMUNITY)
Admission: RE | Admit: 2017-06-08 | Discharge: 2017-06-08 | Disposition: A | Discharge status: Home / Self Care | Payer: Medicare Other | Source: Ambulatory Visit | Attending: Vascular Surgery | Admitting: Vascular Surgery

## 2017-06-08 ENCOUNTER — Encounter (HOSPITAL_COMMUNITY): Payer: Medicare Other

## 2017-06-08 ENCOUNTER — Other Ambulatory Visit (HOSPITAL_COMMUNITY): Payer: Self-pay | Admitting: Orthopedic Surgery

## 2017-06-08 DIAGNOSIS — R0989 Other specified symptoms and signs involving the circulatory and respiratory systems: Secondary | ICD-10-CM

## 2017-06-08 DIAGNOSIS — Z9889 Other specified postprocedural states: Secondary | ICD-10-CM | POA: Insufficient documentation

## 2017-06-11 DIAGNOSIS — M169 Osteoarthritis of hip, unspecified: Secondary | ICD-10-CM | POA: Diagnosis not present

## 2017-06-11 DIAGNOSIS — M8580 Other specified disorders of bone density and structure, unspecified site: Secondary | ICD-10-CM | POA: Diagnosis not present

## 2017-06-15 DIAGNOSIS — H401212 Low-tension glaucoma, right eye, moderate stage: Secondary | ICD-10-CM | POA: Diagnosis not present

## 2017-06-15 DIAGNOSIS — H401221 Low-tension glaucoma, left eye, mild stage: Secondary | ICD-10-CM | POA: Diagnosis not present

## 2017-07-01 DIAGNOSIS — M8000XD Age-related osteoporosis with current pathological fracture, unspecified site, subsequent encounter for fracture with routine healing: Secondary | ICD-10-CM | POA: Diagnosis not present

## 2017-07-01 DIAGNOSIS — K219 Gastro-esophageal reflux disease without esophagitis: Secondary | ICD-10-CM | POA: Diagnosis not present

## 2017-07-12 DIAGNOSIS — M169 Osteoarthritis of hip, unspecified: Secondary | ICD-10-CM | POA: Diagnosis not present

## 2017-07-12 DIAGNOSIS — M8580 Other specified disorders of bone density and structure, unspecified site: Secondary | ICD-10-CM | POA: Diagnosis not present

## 2017-08-09 DIAGNOSIS — M8580 Other specified disorders of bone density and structure, unspecified site: Secondary | ICD-10-CM | POA: Diagnosis not present

## 2017-08-09 DIAGNOSIS — M169 Osteoarthritis of hip, unspecified: Secondary | ICD-10-CM | POA: Diagnosis not present

## 2017-08-11 DIAGNOSIS — R7303 Prediabetes: Secondary | ICD-10-CM | POA: Diagnosis not present

## 2017-08-11 DIAGNOSIS — E7800 Pure hypercholesterolemia, unspecified: Secondary | ICD-10-CM | POA: Diagnosis not present

## 2017-08-11 DIAGNOSIS — G4700 Insomnia, unspecified: Secondary | ICD-10-CM | POA: Diagnosis not present

## 2017-08-11 DIAGNOSIS — D696 Thrombocytopenia, unspecified: Secondary | ICD-10-CM | POA: Diagnosis not present

## 2017-08-18 DIAGNOSIS — M25572 Pain in left ankle and joints of left foot: Secondary | ICD-10-CM | POA: Diagnosis not present

## 2017-09-07 DIAGNOSIS — D2230 Melanocytic nevi of unspecified part of face: Secondary | ICD-10-CM | POA: Diagnosis not present

## 2017-09-07 DIAGNOSIS — Z808 Family history of malignant neoplasm of other organs or systems: Secondary | ICD-10-CM | POA: Diagnosis not present

## 2017-09-07 DIAGNOSIS — L821 Other seborrheic keratosis: Secondary | ICD-10-CM | POA: Diagnosis not present

## 2017-09-07 DIAGNOSIS — L820 Inflamed seborrheic keratosis: Secondary | ICD-10-CM | POA: Diagnosis not present

## 2017-11-14 DIAGNOSIS — W57XXXA Bitten or stung by nonvenomous insect and other nonvenomous arthropods, initial encounter: Secondary | ICD-10-CM | POA: Diagnosis not present

## 2017-11-14 DIAGNOSIS — S80861A Insect bite (nonvenomous), right lower leg, initial encounter: Secondary | ICD-10-CM | POA: Diagnosis not present

## 2017-11-15 DIAGNOSIS — H52203 Unspecified astigmatism, bilateral: Secondary | ICD-10-CM | POA: Diagnosis not present

## 2017-11-15 DIAGNOSIS — H401221 Low-tension glaucoma, left eye, mild stage: Secondary | ICD-10-CM | POA: Diagnosis not present

## 2017-11-15 DIAGNOSIS — H43813 Vitreous degeneration, bilateral: Secondary | ICD-10-CM | POA: Diagnosis not present

## 2017-11-15 DIAGNOSIS — H04123 Dry eye syndrome of bilateral lacrimal glands: Secondary | ICD-10-CM | POA: Diagnosis not present

## 2018-01-23 ENCOUNTER — Other Ambulatory Visit: Payer: Self-pay | Admitting: Family Medicine

## 2018-01-23 DIAGNOSIS — Z1231 Encounter for screening mammogram for malignant neoplasm of breast: Secondary | ICD-10-CM

## 2018-02-09 DIAGNOSIS — M25572 Pain in left ankle and joints of left foot: Secondary | ICD-10-CM | POA: Diagnosis not present

## 2018-02-15 DIAGNOSIS — R7303 Prediabetes: Secondary | ICD-10-CM | POA: Diagnosis not present

## 2018-02-15 DIAGNOSIS — Z0000 Encounter for general adult medical examination without abnormal findings: Secondary | ICD-10-CM | POA: Diagnosis not present

## 2018-02-15 DIAGNOSIS — R42 Dizziness and giddiness: Secondary | ICD-10-CM | POA: Diagnosis not present

## 2018-02-15 DIAGNOSIS — R0789 Other chest pain: Secondary | ICD-10-CM | POA: Diagnosis not present

## 2018-03-09 ENCOUNTER — Ambulatory Visit: Payer: Medicare Other

## 2018-03-10 ENCOUNTER — Ambulatory Visit
Admission: RE | Admit: 2018-03-10 | Discharge: 2018-03-10 | Disposition: A | Discharge status: Home / Self Care | Payer: Medicare Other | Source: Ambulatory Visit | Attending: Family Medicine | Admitting: Family Medicine

## 2018-03-10 DIAGNOSIS — Z1231 Encounter for screening mammogram for malignant neoplasm of breast: Secondary | ICD-10-CM

## 2018-04-13 ENCOUNTER — Other Ambulatory Visit: Payer: Self-pay | Admitting: Family Medicine

## 2018-04-13 ENCOUNTER — Ambulatory Visit
Admission: RE | Admit: 2018-04-13 | Discharge: 2018-04-13 | Disposition: A | Discharge status: Home / Self Care | Payer: Medicare Other | Source: Ambulatory Visit | Attending: Family Medicine | Admitting: Family Medicine

## 2018-04-13 DIAGNOSIS — R05 Cough: Secondary | ICD-10-CM

## 2018-04-13 DIAGNOSIS — R0789 Other chest pain: Secondary | ICD-10-CM | POA: Diagnosis not present

## 2018-04-13 DIAGNOSIS — R059 Cough, unspecified: Secondary | ICD-10-CM

## 2018-04-17 ENCOUNTER — Encounter: Payer: Self-pay | Admitting: *Deleted

## 2018-04-18 ENCOUNTER — Ambulatory Visit: Payer: Medicare Other | Admitting: Diagnostic Neuroimaging

## 2018-04-18 ENCOUNTER — Encounter: Payer: Self-pay | Admitting: Diagnostic Neuroimaging

## 2018-04-18 VITALS — BP 145/85 | HR 64 | Ht 61.0 in | Wt 128.2 lb

## 2018-04-18 DIAGNOSIS — R42 Dizziness and giddiness: Secondary | ICD-10-CM

## 2018-04-18 NOTE — Progress Notes (Signed)
[facility]  PATIENT: Holly Conley DOB: 06-Mar-1936  REFERRING CLINICIAN: Dossie Arbour HISTORY FROM: patient  REASON FOR VISIT: new consult    HISTORICAL  CHIEF COMPLAINT:  Chief Complaint  Patient presents with  . New Patient (Initial Visit)    Rm 6, daughter  . Referred by Dr. Johny Blamer    Persistent vertigo,  also lightheadedness.      HISTORY OF PRESENT ILLNESS:   UPDATE (04/18/18, VRP): 82 year old female returns for evaluation of "dizziness".  Patient has several types of attacks including lightheadedness sensation, which can occur in sitting or standing position.  Patient immediately goes and lays down which helps relieve the symptoms.  No spinning, nausea, numbness of these attacks.  Patient does not have a tach which she describes as a spinning severe sensation, vertigo type attack.  She has had one attack in 2009 and another attack in 2018, when she was recovering from ankle fracture.  Patient end up in the hospital for evaluation.  Patient also having panic attacks, anxiety, insomnia.  Patient has some history of headaches lasting hours at a time, without any other associated symptoms.  Patient also has history of carsickness and motion sickness as a child.  Patient has family history of migraine in her daughter.   UPDATE 08/04/11: Doing much better. No spells since 07/02/11. Focusing on staying hydrated.  PRIOR HPI (06/30/11): 82 year old right-handed female with history of hypertension, hypercholesterolemia, GERD, glaucoma, anxiety, here for evaluation of intermittent episodes of dizziness and lightheadedness since February 2012.  Patient reports intermittent episodes of lightheadedness and feeling as though she might pass out. Sometimes she has staggering sensation and mild vertigo. These episodes have occurred while standing, or sitting. No specific triggering factors. She has been able to work vigorously in her garden, bending and standing,  lifting, without triggering these episodes. In 2009 she an episode of benign positional vertigo, but her current episode seems different.  In September 2012, patient had a 4 week cardiac monitor, had some mild episodes of dizziness, and was diagnosed with intermittent PVCs but no significant arrhythmia. Today patient tells me that she did not have her typical severe episodes during that cardiac monitoring.  Her most recent episodes were on November 23, November 27, December 1, December 7, January 8 and January 17. Episodes lasting 1-4 minutes at a time. She has never fully passed out. She knows to try to lay down and close her eyes and wait until the episode subsided. She has checked her blood sugar, heart rate and blood pressure during these episodes they have been normal.  Patient endorses long-term history of anxiety and panic attacks. Patient denies any nervousness, anxiety, stress or panic attacks with these current episodes.    REVIEW OF SYSTEMS: Full 14 system review of systems performed and negative with exception of: Dizziness not enough sleep allergies constipation joint pain hearing loss spinning sensation palpitations weight loss fatigue blurred vision.  ALLERGIES: Allergies  Allergen Reactions  . Codeine     nauseated  . Morphine And Related     nauseated  . Penicillins     HOME MEDICATIONS: Outpatient Medications Prior to Visit  Medication Sig Dispense Refill  . aspirin EC 81 MG tablet Take 81 mg by mouth daily.    Marland Kitchen CALCIUM-MAGNESIUM-ZINC PO Take 1 tablet by mouth daily.    . cholecalciferol (VITAMIN D) 1000 UNITS tablet Take 2,000 Units by mouth 2 (two) times daily.     . cyclobenzaprine (FLEXERIL)  10 MG tablet Take 10 mg by mouth 3 (three) times daily as needed for muscle spasms.    . diazepam (VALIUM) 10 MG tablet Take 10 mg by mouth every 6 (six) hours as needed for anxiety.    Marland Kitchen latanoprost (XALATAN) 0.005 % ophthalmic solution Place 1 drop into both eyes at bedtime.    1  . Multiple Vitamin (MULTIVITAMIN) tablet Take 1 tablet by mouth daily.    Bertram Gala Glycol-Propyl Glycol (SYSTANE) 0.4-0.3 % SOLN Apply to eye as needed.    . polyethylene glycol (MIRALAX / GLYCOLAX) packet Take 17 g by mouth daily.     . Probiotic Product (PROBIOTIC ADVANCED PO) Take 1 capsule by mouth daily.    Marland Kitchen Propylene Glycol (SYSTANE BALANCE OP) Apply to eye.    . ranitidine (ZANTAC) 300 MG tablet Take 300 mg by mouth 2 (two) times daily.  0  . timolol (BETIMOL) 0.25 % ophthalmic solution Place 1 drop into both eyes daily.    . timolol (TIMOPTIC) 0.25 % ophthalmic solution Administer 1 drop to both eyes daily.    Marland Kitchen aspirin 325 MG tablet Take 325 mg by mouth daily.    . metoprolol succinate (TOPROL-XL) 50 MG 24 hr tablet Take 50 mg by mouth daily. Take with or immediately following a meal.    . timolol (BETIMOL) 0.25 % ophthalmic solution Place 1 drop into both eyes 2 (two) times daily.    . calcium carbonate (TUMS EX) 750 MG chewable tablet Chew by mouth.    Marland Kitchen ibuprofen (ADVIL,MOTRIN) 200 MG tablet Take 200 mg by mouth every 8 (eight) hours as needed.     No facility-administered medications prior to visit.     PAST MEDICAL HISTORY: Past Medical History:  Diagnosis Date  . Anxiety   . Arthritis    knees and hands  . Colon polyps   . Dizziness   . DJD (degenerative joint disease)   . GERD (gastroesophageal reflux disease)   . Glaucoma   . Hyperlipidemia   . Hypertension   . Insomnia   . Multiple allergies   . Osteopenia   . Osteoporosis    R and L hip  . Panic attacks   . Paresthesias   . Prediabetes   . PVC (premature ventricular contraction)   . Restless leg syndrome   . Thrombocytopenia (HCC)   . Vertigo    BPPV  . Vitamin D deficiency     PAST SURGICAL HISTORY: Past Surgical History:  Procedure Laterality Date  . APPENDECTOMY    . CATARACT EXTRACTION, BILATERAL    . closed reduction Right    foot  . DILATION AND CURETTAGE OF UTERUS    . FOOT  SURGERY Left    titanium L ankle  . TONSILLECTOMY AND ADENOIDECTOMY    . TUBAL LIGATION    . VAGINAL HYSTERECTOMY      FAMILY HISTORY: Family History  Problem Relation Age of Onset  . Bladder Cancer Mother   . Diabetes Mellitus I Mother   . Coronary artery disease Mother   . Hypertension Mother   . Heart failure Father   . Heart disease Father   . Hypertension Father   . Colon polyps Father   . Hyperlipidemia Sister   . Hypertension Sister   . Colon polyps Sister     SOCIAL HISTORY: Social History   Socioeconomic History  . Marital status: Married    Spouse name: Not on file  . Number of children: Not on file  .  Years of education: Not on file  . Highest education level: Not on file  Occupational History  . Not on file  Social Needs  . Financial resource strain: Not on file  . Food insecurity:    Worry: Not on file    Inability: Not on file  . Transportation needs:    Medical: Not on file    Non-medical: Not on file  Tobacco Use  . Smoking status: Never Smoker  . Smokeless tobacco: Never Used  Substance and Sexual Activity  . Alcohol use: Yes    Comment: socially  . Drug use: No  . Sexual activity: Not on file  Lifestyle  . Physical activity:    Days per week: Not on file    Minutes per session: Not on file  . Stress: Not on file  Relationships  . Social connections:    Talks on phone: Not on file    Gets together: Not on file    Attends religious service: Not on file    Active member of club or organization: Not on file    Attends meetings of clubs or organizations: Not on file    Relationship status: Not on file  . Intimate partner violence:    Fear of current or ex partner: Not on file    Emotionally abused: Not on file    Physically abused: Not on file    Forced sexual activity: Not on file  Other Topics Concern  . Not on file  Social History Narrative   Lives home alone.  Widowed.  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  Children 3.       PHYSICAL  EXAM  GENERAL EXAM/CONSTITUTIONAL: Vitals:  Vitals:   04/18/18 1338  BP: (!) 145/85  Pulse: 64  Weight: 128 lb 3.2 oz (58.2 kg)  Height: 5\' 1"  (1.549 m)  No data found.     Body mass index is 24.22 kg/m. Wt Readings from Last 3 Encounters:  04/18/18 128 lb 3.2 oz (58.2 kg)  11/10/15 128 lb (58.1 kg)     Patient is in no distress; well developed, nourished and groomed; neck is supple  CARDIOVASCULAR:  Examination of carotid arteries is normal; no carotid bruits  Regular rate and rhythm, no murmurs  Examination of peripheral vascular system by observation and palpation is normal  EYES:  Ophthalmoscopic exam of optic discs and posterior segments is normal; no papilledema or hemorrhages  No exam data present  MUSCULOSKELETAL:  Gait, strength, tone, movements noted in Neurologic exam below  NEUROLOGIC: MENTAL STATUS:  No flowsheet data found.  awake, alert, oriented to person, place and time  recent and remote memory intact  normal attention and concentration  language fluent, comprehension intact, naming intact  fund of knowledge appropriate  CRANIAL NERVE:   2nd - no papilledema on fundoscopic exam  2nd, 3rd, 4th, 6th - pupils equal and reactive to light, visual fields full to confrontation, extraocular muscles intact, no nystagmus  5th - facial sensation symmetric  7th - facial strength symmetric  8th - hearing intact  9th - palate elevates symmetrically, uvula midline  11th - shoulder shrug symmetric  12th - tongue protrusion midline  MOTOR:   normal bulk and tone, full strength in the BUE, BLE  SENSORY:   normal and symmetric to light touch, temperature, vibration  COORDINATION:   finger-nose-finger, fine finger movements normal  REFLEXES:   deep tendon reflexes TRACE and symmetric  GAIT/STATION:   narrow based gait     DIAGNOSTIC DATA (  LABS, IMAGING, TESTING) - I reviewed patient records, labs, notes, testing and  imaging myself where available.  No results found for: WBC, HGB, HCT, MCV, PLT No results found for: NA, K, CL, CO2, GLUCOSE, BUN, CREATININE, CALCIUM, PROT, ALBUMIN, AST, ALT, ALKPHOS, BILITOT, GFRNONAA, GFRAA No results found for: CHOL, HDL, LDLCALC, LDLDIRECT, TRIG, CHOLHDL No results found for: ZOXW9U No results found for: VITAMINB12 No results found for: TSH   07/15/11 MRA head / neck - no significant stenosis  07/23/11 MRI brain - mild to moderate periventricular and subcortical chronic small vessel ischemic disease.  11/10/15 MRI brain  - No acute infarct or intracranial hemorrhage. - Prominent patchy somewhat confluent white matter changes have progressed minimally since prior exam. In a patient of this age with pre diabetes, hypertension and hyperlipidemia, findings are most likely related to result of chronic microvascular changes. Other causes of white matter disease such as that secondary to demyelinating process, inflammatory process or vasculitis are felt to be less likely considerations. - Mild global atrophy without hydrocephalus.  12/14/16 MRI brain [I reviewed images myself and agree with interpretation. -VRP]  1. No acute intracranial process. 2. Stable white matter changes most compatible with moderate to severe chronic small vessel ischemic disease.    ASSESSMENT AND PLAN  82 y.o. year old female here with intermittent episodes of lightheadedness, dizziness, vertigo and anxiety.  Dx: peripheral vestibulopathy, vestibular migraine, transient hypotension  1. Lightheadedness   2. Vertigo     PLAN:  LIGHTHEADEDNESS / DIZZINESS - monitor BP at home; stay hydrated - consider medical / cardiology evaluation for future lightheadedness attacks  INTERMITTENT VERTIGO (?BPV) - monitor; consider vestibular PT  ANXIETY DISORDER - consider better treatment of anxiety  Return if symptoms worsen or fail to improve, for return to PCP.    Suanne Marker,  MD 04/18/2018, 2:05 PM Certified in Neurology, Neurophysiology and Neuroimaging  [facility] [address] [place], [place] [address] [phone]

## 2018-04-18 NOTE — Patient Instructions (Signed)
LIGHTHEADEDNESS / DIZZINESS - monitor BP at home; stay hydrated - consider medical / cardiology evaluation for future lightheadedness attacks  INTERMITTENT VERTIGO (?BPV) - monitor; consider vestibular PT  ANXIETY DISORDER - consider better treatment of anxiety

## 2018-05-17 DIAGNOSIS — H401212 Low-tension glaucoma, right eye, moderate stage: Secondary | ICD-10-CM | POA: Diagnosis not present

## 2018-05-17 DIAGNOSIS — H401221 Low-tension glaucoma, left eye, mild stage: Secondary | ICD-10-CM | POA: Diagnosis not present

## 2018-05-17 DIAGNOSIS — H04123 Dry eye syndrome of bilateral lacrimal glands: Secondary | ICD-10-CM | POA: Diagnosis not present

## 2018-08-08 DIAGNOSIS — M542 Cervicalgia: Secondary | ICD-10-CM | POA: Diagnosis not present

## 2018-08-08 DIAGNOSIS — M4722 Other spondylosis with radiculopathy, cervical region: Secondary | ICD-10-CM | POA: Diagnosis not present

## 2018-08-08 DIAGNOSIS — M67912 Unspecified disorder of synovium and tendon, left shoulder: Secondary | ICD-10-CM | POA: Diagnosis not present

## 2018-08-08 DIAGNOSIS — M25572 Pain in left ankle and joints of left foot: Secondary | ICD-10-CM | POA: Diagnosis not present

## 2018-11-15 DIAGNOSIS — H0100A Unspecified blepharitis right eye, upper and lower eyelids: Secondary | ICD-10-CM | POA: Diagnosis not present

## 2018-11-15 DIAGNOSIS — H401212 Low-tension glaucoma, right eye, moderate stage: Secondary | ICD-10-CM | POA: Diagnosis not present

## 2018-11-15 DIAGNOSIS — D485 Neoplasm of uncertain behavior of skin: Secondary | ICD-10-CM | POA: Diagnosis not present

## 2018-11-15 DIAGNOSIS — H0100B Unspecified blepharitis left eye, upper and lower eyelids: Secondary | ICD-10-CM | POA: Diagnosis not present

## 2018-11-21 DIAGNOSIS — E559 Vitamin D deficiency, unspecified: Secondary | ICD-10-CM | POA: Diagnosis not present

## 2018-11-21 DIAGNOSIS — E7800 Pure hypercholesterolemia, unspecified: Secondary | ICD-10-CM | POA: Diagnosis not present

## 2018-11-21 DIAGNOSIS — I10 Essential (primary) hypertension: Secondary | ICD-10-CM | POA: Diagnosis not present

## 2018-11-21 DIAGNOSIS — R7303 Prediabetes: Secondary | ICD-10-CM | POA: Diagnosis not present

## 2019-01-20 ENCOUNTER — Emergency Department (HOSPITAL_COMMUNITY)
Admission: EM | Admit: 2019-01-20 | Discharge: 2019-01-20 | Disposition: A | Discharge status: Home / Self Care | Payer: Medicare Other | Attending: Emergency Medicine | Admitting: Emergency Medicine

## 2019-01-20 ENCOUNTER — Emergency Department (HOSPITAL_COMMUNITY): Payer: Medicare Other

## 2019-01-20 ENCOUNTER — Encounter (HOSPITAL_COMMUNITY): Payer: Self-pay | Admitting: Emergency Medicine

## 2019-01-20 ENCOUNTER — Other Ambulatory Visit: Payer: Self-pay

## 2019-01-20 DIAGNOSIS — I491 Atrial premature depolarization: Secondary | ICD-10-CM | POA: Diagnosis not present

## 2019-01-20 DIAGNOSIS — Z79899 Other long term (current) drug therapy: Secondary | ICD-10-CM | POA: Insufficient documentation

## 2019-01-20 DIAGNOSIS — Z7982 Long term (current) use of aspirin: Secondary | ICD-10-CM | POA: Diagnosis not present

## 2019-01-20 DIAGNOSIS — R51 Headache: Secondary | ICD-10-CM | POA: Diagnosis not present

## 2019-01-20 DIAGNOSIS — R42 Dizziness and giddiness: Secondary | ICD-10-CM | POA: Diagnosis not present

## 2019-01-20 DIAGNOSIS — I10 Essential (primary) hypertension: Secondary | ICD-10-CM | POA: Diagnosis not present

## 2019-01-20 DIAGNOSIS — I959 Hypotension, unspecified: Secondary | ICD-10-CM | POA: Diagnosis not present

## 2019-01-20 LAB — COMPREHENSIVE METABOLIC PANEL
ALT: 16 U/L (ref 0–44)
AST: 18 U/L (ref 15–41)
Albumin: 4.2 g/dL (ref 3.5–5.0)
Alkaline Phosphatase: 45 U/L (ref 38–126)
Anion gap: 9 (ref 5–15)
BUN: 9 mg/dL (ref 8–23)
CO2: 27 mmol/L (ref 22–32)
Calcium: 9.7 mg/dL (ref 8.9–10.3)
Chloride: 108 mmol/L (ref 98–111)
Creatinine, Ser: 0.79 mg/dL (ref 0.44–1.00)
GFR calc Af Amer: >60 mL/min (ref >60)
GFR calc non Af Amer: >60 mL/min (ref >60)
Glucose, Bld: 104 mg/dL — ABNORMAL HIGH (ref 70–99)
Potassium: 4.2 mmol/L (ref 3.5–5.1)
Sodium: 144 mmol/L (ref 135–145)
Total Bilirubin: 0.8 mg/dL (ref 0.3–1.2)
Total Protein: 6.8 g/dL (ref 6.5–8.1)

## 2019-01-20 LAB — TROPONIN I (HIGH SENSITIVITY)
Troponin I (High Sensitivity): 4 ng/L (ref <18)
Troponin I (High Sensitivity): 4 ng/L (ref <18)

## 2019-01-20 LAB — CBC WITH DIFFERENTIAL/PLATELET
Abs Immature Granulocytes: 0.01 10*3/uL (ref 0.00–0.07)
Basophils Absolute: 0 10*3/uL (ref 0.0–0.1)
Basophils Relative: 1 %
Eosinophils Absolute: 0.1 10*3/uL (ref 0.0–0.5)
Eosinophils Relative: 2 %
HCT: 45.8 % (ref 36.0–46.0)
Hemoglobin: 15.5 g/dL — ABNORMAL HIGH (ref 12.0–15.0)
Immature Granulocytes: 0 %
Lymphocytes Relative: 33 %
Lymphs Abs: 1.5 10*3/uL (ref 0.7–4.0)
MCH: 32.1 pg (ref 26.0–34.0)
MCHC: 33.8 g/dL (ref 30.0–36.0)
MCV: 94.8 fL (ref 80.0–100.0)
Monocytes Absolute: 0.3 10*3/uL (ref 0.1–1.0)
Monocytes Relative: 8 %
Neutro Abs: 2.4 10*3/uL (ref 1.7–7.7)
Neutrophils Relative %: 56 %
Platelets: 145 10*3/uL — ABNORMAL LOW (ref 150–400)
RBC: 4.83 MIL/uL (ref 3.87–5.11)
RDW: 12.3 % (ref 11.5–15.5)
WBC: 4.3 10*3/uL (ref 4.0–10.5)
nRBC: 0 % (ref 0.0–0.2)

## 2019-01-20 LAB — URINALYSIS, ROUTINE W REFLEX MICROSCOPIC
Bilirubin Urine: NEGATIVE
Glucose, UA: NEGATIVE mg/dL
Hgb urine dipstick: NEGATIVE
Ketones, ur: NEGATIVE mg/dL
Leukocytes,Ua: NEGATIVE
Nitrite: NEGATIVE
Protein, ur: NEGATIVE mg/dL
Specific Gravity, Urine: 1.006 (ref 1.005–1.030)
pH: 7 (ref 5.0–8.0)

## 2019-01-20 MED ORDER — SODIUM CHLORIDE 0.9 % IV BOLUS
1000.0000 mL | Freq: Once | INTRAVENOUS | Status: AC
  Administered 2019-01-20: 1000 mL via INTRAVENOUS

## 2019-01-20 MED ORDER — ACETAMINOPHEN 500 MG PO TABS
1000.0000 mg | ORAL_TABLET | Freq: Once | ORAL | Status: DC
  Filled 2019-01-20: qty 2

## 2019-01-20 MED ORDER — LORAZEPAM 2 MG/ML IJ SOLN
1.0000 mg | Freq: Once | INTRAMUSCULAR | Status: AC | PRN
  Administered 2019-01-20: 13:00:00 1 mg via INTRAVENOUS
  Filled 2019-01-20: qty 1

## 2019-01-20 NOTE — ED Triage Notes (Signed)
Pt arrives via EMS from home with reports of dizziness starting at 10 pm last night, that her balance is off when standing. Never felt like the room was spinning. Endorses intermittent headaches with vision changes. EMS reports initial BP of 200 and no neuro deficits.

## 2019-01-20 NOTE — ED Notes (Signed)
Ambulated to bathroom with little assistance. Pt stated she felt "funny" in the head. Pt gait was steady and pt walked fast.

## 2019-01-20 NOTE — ED Provider Notes (Signed)
[facility]   CSN: [id number] Arrival date & time: 01/20/19  1055    History   Chief Complaint Chief Complaint  Patient presents with  . Dizziness    HPI Holly Conley is a 83 y.o. female.     HPI  Started last night, not sure what time exactly Started to feel lightheaded, took valium because had stressful day Woke up at 7AM to take medicine, always sits a little before standing, got up and felt like she felt drunk the way she was walking, turned lights on and walked to computer.  Feeling off balance, didn't feel like vertigo she has had.  BP was high.  Still have feeling of off balance in her head. Called her next door neighbor and it persisted and neighbor is retired Engineer, civil (consulting)nurse also and she recommended calling 911 to go to ED.   Still feeling not right.  Took medicine this AM, famotidine and water.  Denies headache, focal numbness or weakness, change in vision, difficulty walking with exception of feeling "not right", facial droop, difficulty speaking.  Reports she does have some pain in the left upper chest by her shoulder, which does seem to be exacerbated by movement. Recently began talking several OTC vitamins/medicines including papaya tablets and wondering if this is contributing.    Past Medical History:  Diagnosis Date  . Anxiety   . Arthritis    knees and hands  . Colon polyps   . Dizziness   . DJD (degenerative joint disease)   . GERD (gastroesophageal reflux disease)   . Glaucoma   . Hyperlipidemia   . Hypertension   . Insomnia   . Multiple allergies   . Osteopenia   . Osteoporosis    R and L hip  . Panic attacks   . Paresthesias   . Prediabetes   . PVC (premature ventricular contraction)   . Restless leg syndrome   . Thrombocytopenia (HCC)   . Vertigo    BPPV  . Vitamin D deficiency     There are no active problems to display for this patient.   Past Surgical History:  Procedure Laterality Date   . APPENDECTOMY    . CATARACT EXTRACTION, BILATERAL    . closed reduction Right    foot  . DILATION AND CURETTAGE OF UTERUS    . FOOT SURGERY Left    titanium L ankle  . TONSILLECTOMY AND ADENOIDECTOMY    . TUBAL LIGATION    . VAGINAL HYSTERECTOMY       OB History   No obstetric history on file.      Home Medications    Prior to Admission medications   Medication Sig Start Date End Date Taking? Authorizing Provider  aspirin EC 81 MG tablet Take 81 mg by mouth daily.    [provider]  CALCIUM-MAGNESIUM-ZINC PO Take 1 tablet by mouth daily.    [provider]  cholecalciferol (VITAMIN D) 1000 UNITS tablet Take 2,000 Units by mouth 2 (two) times daily.     [provider]  cyclobenzaprine (FLEXERIL) 10 MG tablet Take 10 mg by mouth 3 (three) times daily as needed for muscle spasms.    [provider]  diazepam (VALIUM) 10 MG tablet Take 10 mg by mouth every 6 (six) hours as needed for anxiety.    [provider]  latanoprost (XALATAN) 0.005 % ophthalmic solution Place 1 drop into both eyes at bedtime.  03/17/18   [provider]  Multiple Vitamin (MULTIVITAMIN) tablet Take 1 tablet by mouth daily.    [provider]  Polyethyl Glycol-Propyl Glycol (SYSTANE) 0.4-0.3 % SOLN Apply to eye as needed.    [provider]  polyethylene glycol (MIRALAX / GLYCOLAX) packet Take 17 g by mouth daily.     [provider]  Probiotic Product (PROBIOTIC ADVANCED PO) Take 1 capsule by mouth daily.    [provider]  Propylene Glycol (SYSTANE BALANCE OP) Apply to eye.    [provider]  ranitidine (ZANTAC) 300 MG tablet Take 300 mg by mouth 2 (two) times daily. 01/16/18   [provider]  timolol (BETIMOL) 0.25 % ophthalmic solution Place 1 drop into both eyes daily.    [provider]  timolol (TIMOPTIC) 0.25 % ophthalmic solution Administer 1 drop to both eyes daily.    [provider]    Family History Family History  Problem Relation Age of Onset  . Bladder Cancer Mother   . Diabetes Mellitus I Mother   . Coronary artery disease Mother   . Hypertension Mother   . Heart failure Father   . Heart disease Father   . Hypertension Father   . Colon polyps Father   . Hyperlipidemia Sister   . Hypertension Sister   . Colon polyps Sister     Social History Social History   Tobacco Use  . Smoking status: Never Smoker  . Smokeless tobacco: Never Used  Substance Use Topics  . Alcohol use: Yes    Comment: socially  . Drug use: No     Allergies   Codeine, Morphine and related, and Penicillins   Review of Systems Review of Systems  Constitutional: Positive for fatigue. Negative for fever.  HENT: Negative for sore throat.   Eyes: Negative for visual disturbance.  Respiratory: Negative for cough and shortness of breath.   Cardiovascular: Positive for chest pain.  Gastrointestinal: Negative for abdominal pain, nausea and vomiting.  Genitourinary: Negative for difficulty urinating.  Musculoskeletal: Negative for back pain and neck pain.  Skin: Negative for rash.  Neurological: Positive for dizziness and light-headedness. Negative for syncope, facial asymmetry, speech difficulty, weakness, numbness and headaches.     Physical Exam Updated Vital Signs BP (!) 141/82   Pulse 61   Temp 97.9 F (36.6 C)   Resp (!) 24   Ht 5\' 1"  (1.549 m)   Wt 57.6 kg   SpO2 96%   BMI 24.00 kg/m   Physical Exam Vitals signs and nursing note reviewed.  Constitutional:      General: She is not in acute distress.    Appearance: She is well-developed. She is not diaphoretic.  HENT:     Head: Normocephalic and atraumatic.  Eyes:     Conjunctiva/sclera: Conjunctivae normal.  Neck:     Musculoskeletal: Normal range of motion.  Cardiovascular:     Rate and Rhythm: Normal rate and regular rhythm.     Heart sounds: Normal heart sounds. No murmur. No friction  rub. No gallop.   Pulmonary:     Effort: Pulmonary effort is normal. No respiratory distress.     Breath sounds: Normal breath sounds. No wheezing or rales.  Abdominal:     General: There is no distension.     Palpations: Abdomen is soft.     Tenderness: There is no abdominal tenderness. There is no guarding.  Musculoskeletal:        General: No tenderness.  Skin:    General:  Skin is warm and dry.     Findings: No erythema or rash.  Neurological:     Mental Status: She is alert and oriented to person, place, and time.     GCS: GCS eye subscore is 4. GCS verbal subscore is 5. GCS motor subscore is 6.     Cranial Nerves: Cranial nerves are intact.     Sensory: Sensation is intact. No sensory deficit.     Motor: Motor function is intact.     Coordination: Coordination is intact. Coordination normal. Heel to Shin Test normal.     Gait: Gait is intact. Abnormal gait: does not appear ataxic but reports feeling off balance as she walks.      ED Treatments / Results  Labs (all labs ordered are listed, but only abnormal results are displayed) Labs Reviewed  CBC WITH DIFFERENTIAL/PLATELET - Abnormal; Notable for the following components:      Result Value   Hemoglobin 15.5 (*)    Platelets 145 (*)    All other components within normal limits  COMPREHENSIVE METABOLIC PANEL - Abnormal; Notable for the following components:   Glucose, Bld 104 (*)    All other components within normal limits  URINALYSIS, ROUTINE W REFLEX MICROSCOPIC - Abnormal; Notable for the following components:   Color, Urine STRAW (*)    All other components within normal limits  URINE CULTURE  TROPONIN I (HIGH SENSITIVITY)  TROPONIN I (HIGH SENSITIVITY)    EKG EKG Interpretation  Date/Time:  Saturday January 20 2019 10:56:23 EDT Ventricular Rate:  56 PR Interval:    QRS Duration: 81 QT Interval:  399 QTC Calculation: 385 R Axis:   19 Text Interpretation:  Sinus rhythm Atrial premature complexes Abnormal  R-wave progression, early transition Nonspecific T abnormalities, diffuse leads No previous ECGs available Confirmed by Gareth Morgan [id number]) on 01/20/2019 1:44:04 PM   Radiology Mr Brain Wo Contrast  Result Date: 01/20/2019 CLINICAL DATA:  Persistent central vertigo EXAM: MRI HEAD WITHOUT CONTRAST TECHNIQUE: Multiplanar, multiecho pulse sequences of the brain and surrounding structures were obtained without intravenous contrast. COMPARISON:  11/10/2015 FINDINGS: Brain: No acute infarction, hemorrhage, hydrocephalus, extra-axial collection or mass lesion. Moderate to extensive FLAIR hyperintensity in the cerebral white matter with mild progression from prior-attributed to chronic small vessel ischemia given age and multiple vascular risk factors. Age congruent cerebral volume loss. Vascular: Major flow voids are preserved, including vertebrobasilar. Skull and upper cervical spine: Negative for marrow lesion Sinuses/Orbits: Bilateral cataract resection. Clear mastoid and middle ear spaces. IMPRESSION: 1. No emergent finding. 2. Moderate to extensive chronic small vessel ischemia with mild progression from 2017. Electronically Signed   By: Monte Fantasia M.D.   On: 01/20/2019 14:08    Procedures Procedures (including critical care time)  Medications Ordered in ED Medications  sodium chloride 0.9 % bolus 1,000 mL (0 mLs Intravenous Stopped 01/20/19 1538)  LORazepam (ATIVAN) injection 1 mg (1 mg Intravenous Given 01/20/19 1257)     Initial Impression / Assessment and Plan / ED Course  I have reviewed the triage vital signs and the nursing notes.  Pertinent labs & imaging results that were available during my care of the patient were reviewed by me and considered in my medical decision making (see chart for details).        83 year old female with history of prediabetes, vertigo, PVCs, hypertension (although pt denies and is not on medications), hyperlipidemia presents with concern for  feeling off balance.  Patient not within code stroke window.  EKG without significant findings.  Labs show no significant anemia, electrolyte abnormalities.  I do not see signs of otitis media on exam.  She has a normal neurologic exam although reports persisting sensation of being off balance.  Delta troponins obtained given shoulder pain and negative, suspect this shoulder pain is msk.   MRI obtained shows no evidence of CVA.  Discussed that it is possible that her elevated blood pressures this morning may have contributed to symptoms, however at this time her blood pressures have improved.  Also unclear if the hypertension she had was related to the stress from her symptoms, and given improvement of blood pressures at this time, do not recommend initiating blood pressure medications and recommend close follow-up with her primary care doctor.    Final Clinical Impressions(s) / ED Diagnoses   Final diagnoses:  Dizziness    ED Discharge Orders    None       Alvira MondaySchlossman, Alan Riles, MD 01/21/19 [phone]

## 2019-01-21 LAB — URINE CULTURE: Culture: <10000 — AB

## 2019-01-29 DIAGNOSIS — R42 Dizziness and giddiness: Secondary | ICD-10-CM | POA: Diagnosis not present

## 2019-02-14 DIAGNOSIS — H04123 Dry eye syndrome of bilateral lacrimal glands: Secondary | ICD-10-CM | POA: Diagnosis not present

## 2019-02-14 DIAGNOSIS — D485 Neoplasm of uncertain behavior of skin: Secondary | ICD-10-CM | POA: Diagnosis not present

## 2019-02-14 DIAGNOSIS — H401212 Low-tension glaucoma, right eye, moderate stage: Secondary | ICD-10-CM | POA: Diagnosis not present

## 2019-02-28 DIAGNOSIS — L570 Actinic keratosis: Secondary | ICD-10-CM | POA: Diagnosis not present

## 2019-02-28 DIAGNOSIS — L820 Inflamed seborrheic keratosis: Secondary | ICD-10-CM | POA: Diagnosis not present

## 2019-02-28 DIAGNOSIS — D2230 Melanocytic nevi of unspecified part of face: Secondary | ICD-10-CM | POA: Diagnosis not present

## 2019-02-28 DIAGNOSIS — Z23 Encounter for immunization: Secondary | ICD-10-CM | POA: Diagnosis not present

## 2019-03-07 ENCOUNTER — Other Ambulatory Visit: Payer: Self-pay | Admitting: Family Medicine

## 2019-03-07 DIAGNOSIS — Z1231 Encounter for screening mammogram for malignant neoplasm of breast: Secondary | ICD-10-CM

## 2019-03-14 ENCOUNTER — Other Ambulatory Visit: Payer: Self-pay

## 2019-03-14 ENCOUNTER — Ambulatory Visit
Admission: RE | Admit: 2019-03-14 | Discharge: 2019-03-14 | Disposition: A | Discharge status: Home / Self Care | Payer: Medicare Other | Source: Ambulatory Visit | Attending: Family Medicine | Admitting: Family Medicine

## 2019-03-14 DIAGNOSIS — Z1231 Encounter for screening mammogram for malignant neoplasm of breast: Secondary | ICD-10-CM

## 2019-03-29 DIAGNOSIS — I10 Essential (primary) hypertension: Secondary | ICD-10-CM | POA: Diagnosis not present

## 2019-03-29 DIAGNOSIS — E785 Hyperlipidemia, unspecified: Secondary | ICD-10-CM | POA: Diagnosis not present

## 2019-03-29 DIAGNOSIS — K219 Gastro-esophageal reflux disease without esophagitis: Secondary | ICD-10-CM | POA: Diagnosis not present

## 2019-03-29 DIAGNOSIS — Z0000 Encounter for general adult medical examination without abnormal findings: Secondary | ICD-10-CM | POA: Diagnosis not present

## 2019-04-24 DIAGNOSIS — L820 Inflamed seborrheic keratosis: Secondary | ICD-10-CM | POA: Diagnosis not present

## 2019-04-24 DIAGNOSIS — L570 Actinic keratosis: Secondary | ICD-10-CM | POA: Diagnosis not present

## 2019-05-08 DIAGNOSIS — L570 Actinic keratosis: Secondary | ICD-10-CM | POA: Diagnosis not present

## 2019-05-08 DIAGNOSIS — H401212 Low-tension glaucoma, right eye, moderate stage: Secondary | ICD-10-CM | POA: Diagnosis not present

## 2019-05-22 DIAGNOSIS — M25551 Pain in right hip: Secondary | ICD-10-CM | POA: Diagnosis not present

## 2019-05-22 DIAGNOSIS — S93401A Sprain of unspecified ligament of right ankle, initial encounter: Secondary | ICD-10-CM | POA: Diagnosis not present

## 2019-06-17 ENCOUNTER — Ambulatory Visit: Payer: Medicare Other | Attending: Internal Medicine

## 2019-06-17 DIAGNOSIS — Z23 Encounter for immunization: Secondary | ICD-10-CM

## 2019-06-17 NOTE — Progress Notes (Signed)
   Covid-19 Vaccination Clinic  Name:  LARRAINE ARGO    MRN: [id number] DOB: [birth year]  06/17/2019  Ms. Deprey was observed post Covid-19 immunization for 30 minutes based on pre-vaccination screening without incidence. She was provided with Vaccine Information Sheet and instruction to access the V-Safe system.   Ms. Urbani was instructed to call 911 with any severe reactions post vaccine: Marland Kitchen Difficulty breathing  . Swelling of your face and throat  . A fast heartbeat  . A bad rash all over your body  . Dizziness and weakness    Immunizations Administered    Name Date Dose VIS Date Route   Pfizer COVID-19 Vaccine 06/17/2019 12:15 PM 0.3 mL 05/18/2019 Intramuscular   Manufacturer: [employer], [employer]   Lot: [id number]   NDC: [address]-0

## 2019-06-18 ENCOUNTER — Ambulatory Visit: Payer: Medicare Other

## 2019-06-27 DIAGNOSIS — K644 Residual hemorrhoidal skin tags: Secondary | ICD-10-CM | POA: Diagnosis not present

## 2019-06-27 DIAGNOSIS — L570 Actinic keratosis: Secondary | ICD-10-CM | POA: Diagnosis not present

## 2019-06-27 DIAGNOSIS — L820 Inflamed seborrheic keratosis: Secondary | ICD-10-CM | POA: Diagnosis not present

## 2019-07-08 ENCOUNTER — Ambulatory Visit: Payer: Medicare Other | Attending: Internal Medicine

## 2019-07-08 DIAGNOSIS — Z23 Encounter for immunization: Secondary | ICD-10-CM | POA: Insufficient documentation

## 2019-07-08 NOTE — Progress Notes (Signed)
   Covid-19 Vaccination Clinic  Name:  Holly Conley    MRN: [id number] DOB: September 14, 1935  07/08/2019  [name] was observed post Covid-19 immunization for 15 minutes without incidence. She was provided with Vaccine Information Sheet and instruction to access the V-Safe system.   [name] was instructed to call 911 with any severe reactions post vaccine: Marland Kitchen Difficulty breathing  . Swelling of your face and throat  . A fast heartbeat  . A bad rash all over your body  . Dizziness and weakness    Immunizations Administered    Name Date Dose VIS Date Route   Pfizer COVID-19 Vaccine 07/08/2019 11:34 AM 0.3 mL 05/18/2019 Intramuscular   Manufacturer: [employer], [employer]   Lot: [id number]   NDC: [address]-4

## 2019-09-27 DIAGNOSIS — R7303 Prediabetes: Secondary | ICD-10-CM | POA: Diagnosis not present

## 2019-09-27 DIAGNOSIS — I10 Essential (primary) hypertension: Secondary | ICD-10-CM | POA: Diagnosis not present

## 2019-09-27 DIAGNOSIS — F411 Generalized anxiety disorder: Secondary | ICD-10-CM | POA: Diagnosis not present

## 2019-09-27 DIAGNOSIS — E7800 Pure hypercholesterolemia, unspecified: Secondary | ICD-10-CM | POA: Diagnosis not present

## 2019-09-28 DIAGNOSIS — H04123 Dry eye syndrome of bilateral lacrimal glands: Secondary | ICD-10-CM | POA: Diagnosis not present

## 2019-09-28 DIAGNOSIS — H401212 Low-tension glaucoma, right eye, moderate stage: Secondary | ICD-10-CM | POA: Diagnosis not present

## 2019-09-28 DIAGNOSIS — H0100A Unspecified blepharitis right eye, upper and lower eyelids: Secondary | ICD-10-CM | POA: Diagnosis not present

## 2019-09-28 DIAGNOSIS — H43813 Vitreous degeneration, bilateral: Secondary | ICD-10-CM | POA: Diagnosis not present

## 2019-10-23 DIAGNOSIS — E785 Hyperlipidemia, unspecified: Secondary | ICD-10-CM | POA: Diagnosis not present

## 2019-10-23 DIAGNOSIS — I10 Essential (primary) hypertension: Secondary | ICD-10-CM | POA: Diagnosis not present

## 2019-10-23 DIAGNOSIS — G4700 Insomnia, unspecified: Secondary | ICD-10-CM | POA: Diagnosis not present

## 2019-10-23 DIAGNOSIS — H4089 Other specified glaucoma: Secondary | ICD-10-CM | POA: Diagnosis not present

## 2019-11-29 DIAGNOSIS — H6121 Impacted cerumen, right ear: Secondary | ICD-10-CM | POA: Diagnosis not present

## 2019-11-29 DIAGNOSIS — R42 Dizziness and giddiness: Secondary | ICD-10-CM | POA: Diagnosis not present

## 2019-11-29 DIAGNOSIS — H903 Sensorineural hearing loss, bilateral: Secondary | ICD-10-CM | POA: Diagnosis not present

## 2019-12-04 DIAGNOSIS — M25572 Pain in left ankle and joints of left foot: Secondary | ICD-10-CM | POA: Diagnosis not present

## 2019-12-04 DIAGNOSIS — M25552 Pain in left hip: Secondary | ICD-10-CM | POA: Diagnosis not present

## 2019-12-04 DIAGNOSIS — M25551 Pain in right hip: Secondary | ICD-10-CM | POA: Diagnosis not present

## 2019-12-04 DIAGNOSIS — M79672 Pain in left foot: Secondary | ICD-10-CM | POA: Diagnosis not present

## 2019-12-05 DIAGNOSIS — E785 Hyperlipidemia, unspecified: Secondary | ICD-10-CM | POA: Diagnosis not present

## 2019-12-05 DIAGNOSIS — E7800 Pure hypercholesterolemia, unspecified: Secondary | ICD-10-CM | POA: Diagnosis not present

## 2019-12-05 DIAGNOSIS — I10 Essential (primary) hypertension: Secondary | ICD-10-CM | POA: Diagnosis not present

## 2019-12-05 DIAGNOSIS — H4089 Other specified glaucoma: Secondary | ICD-10-CM | POA: Diagnosis not present

## 2019-12-11 DIAGNOSIS — R42 Dizziness and giddiness: Secondary | ICD-10-CM | POA: Diagnosis not present

## 2019-12-25 DIAGNOSIS — H903 Sensorineural hearing loss, bilateral: Secondary | ICD-10-CM | POA: Diagnosis not present

## 2019-12-25 DIAGNOSIS — R42 Dizziness and giddiness: Secondary | ICD-10-CM | POA: Diagnosis not present

## 2020-01-07 DIAGNOSIS — Z0120 Encounter for dental examination and cleaning without abnormal findings: Secondary | ICD-10-CM | POA: Diagnosis not present

## 2020-01-15 DIAGNOSIS — H0100A Unspecified blepharitis right eye, upper and lower eyelids: Secondary | ICD-10-CM | POA: Diagnosis not present

## 2020-01-15 DIAGNOSIS — H401231 Low-tension glaucoma, bilateral, mild stage: Secondary | ICD-10-CM | POA: Diagnosis not present

## 2020-01-15 DIAGNOSIS — H0100B Unspecified blepharitis left eye, upper and lower eyelids: Secondary | ICD-10-CM | POA: Diagnosis not present

## 2020-01-22 ENCOUNTER — Ambulatory Visit: Payer: Medicare Other | Admitting: Physical Therapy

## 2020-01-24 ENCOUNTER — Other Ambulatory Visit: Payer: Self-pay

## 2020-01-24 ENCOUNTER — Encounter: Payer: Self-pay | Admitting: Physical Therapy

## 2020-01-24 ENCOUNTER — Ambulatory Visit: Payer: Medicare Other | Attending: Otolaryngology | Admitting: Physical Therapy

## 2020-01-24 DIAGNOSIS — R42 Dizziness and giddiness: Secondary | ICD-10-CM | POA: Diagnosis not present

## 2020-01-24 DIAGNOSIS — R2681 Unsteadiness on feet: Secondary | ICD-10-CM | POA: Insufficient documentation

## 2020-01-24 NOTE — Patient Instructions (Addendum)
   Standing Marching   Using a chair if necessary, march in place. Repeat 10 times. Do 1 sessions per day.  http://[email or web address].exer.us/344     Hip Backward Kick   Using a chair for balance, keep legs shoulder width apart and toes pointed for- ward. Slowly extend one leg back, keeping knee straight. Do not lean forward. Repeat with other leg. Repeat 10 times. Do 1 sessions per day.  http://[email or web address].exer.us/340   Copyright  VHI. All rights reserved.     Hip Side Kick   Holding a chair for balance, keep legs shoulder width apart and toes pointed forward. Swing a leg out to side, keeping knee straight. Do not lean. Repeat using other leg. Repeat 10 times. Do 1 sessions per day.  http://[email or web address].exer.us/342   Also do FORWARD KICKS - 10 reps each leg alternating      Standing On One Leg Without Support .  Stand on one leg in neutral spine without support. Hold 10 seconds. Repeat on other leg. Do 2 repetitions, 1 sets.  http://bt.exer.us/36      Side-Stepping   Walk to left side with eyes open. Take even steps, leading with same foot. Make sure each foot lifts off the floor. Repeat in opposite direction. Do 1 sessions per day.   Copyright  VHI. All rights reserved.       Sit to Side-Lying    Sit on edge of bed. 1. Turn head 45 to right. 2. Maintain head position and lie down slowly on left side. Hold until symptoms subside. 3. Sit up slowly. Hold until symptoms subside. 4. Turn head 45 to left. 5. Maintain head position and lie down slowly on right side. Hold until symptoms subside. 6. Sit up slowly. Repeat sequence _5_ times per session. Do __3-5__ sessions per day.    Self Treatment for Left Posterior / Anterior Canalithiasis    Sitting on bed: 1. Turn head 45 left. (a) Lie back slowly, shoulders on pillow, head on bed. (b) Hold _20-30___ seconds. 2. Keeping head on bed, turn head 90 right. Hold _20-30___ seconds. 3. Roll to right, head on 45 angle down toward  bed. Hold __20-30__ seconds. 4. Sit up on right side of bed. Repeat __3__ times per session. Do _2___ sessions per day.

## 2020-01-25 DIAGNOSIS — H524 Presbyopia: Secondary | ICD-10-CM | POA: Diagnosis not present

## 2020-01-25 NOTE — Therapy (Signed)
[facility] [address] [place], [place], [address] Phone: [phone]   Fax:  [phone]  Physical Therapy Evaluation  Patient Details  Name: Holly Conley MRN: [id number] Date of Birth: 1935/06/17 Referring Provider (PT): Dr. Newman Pies   Encounter Date: 01/24/2020   PT End of Session - 01/25/20 1325    Visit Number 1    Authorization Type BCBS Medicare    Authorization Time Period 01-24-20 -  03-25-20    PT Start Time 1105    PT Stop Time 1150    PT Time Calculation (min) 45 min    Activity Tolerance Patient tolerated treatment well    Behavior During Therapy [facility] for tasks assessed/performed           Past Medical History:  Diagnosis Date  . Anxiety   . Arthritis    knees and hands  . Colon polyps   . Dizziness   . DJD (degenerative joint disease)   . GERD (gastroesophageal reflux disease)   . Glaucoma   . Hyperlipidemia   . Hypertension   . Insomnia   . Multiple allergies   . Osteopenia   . Osteoporosis    R and L hip  . Panic attacks   . Paresthesias   . Prediabetes   . PVC (premature ventricular contraction)   . Restless leg syndrome   . Thrombocytopenia (HCC)   . Vertigo    BPPV  . Vitamin D deficiency     Past Surgical History:  Procedure Laterality Date  . APPENDECTOMY    . CATARACT EXTRACTION, BILATERAL    . closed reduction Right    foot  . DILATION AND CURETTAGE OF UTERUS    . FOOT SURGERY Left    titanium L ankle  . TONSILLECTOMY AND ADENOIDECTOMY    . TUBAL LIGATION    . VAGINAL HYSTERECTOMY      There were no vitals filed for this visit.    Subjective Assessment - 01/25/20 1305    Subjective Pt states she saw Dr. Suszanne Conners on 12-11-19 and underwent testing for vertigo.  Pt states she had onset of vertigo in 2018 while she was recovering from fractures of her tibia and fibula. Pt went to ED on 01-20-19 due to episode of vertigo and light-headedness.that had started on evening  of 01-19-20.  CT scan was negaitve for CVA - per notes elevated BP at time of admission could have contributted to the dizziness (no etiology other than this is reported in the ED note).  Pt reports no dizziness at this time.    Pertinent History OA, osteoporosis, HTN, glaucoma, catarcts, irregular heart beat, depression, anxiety    Patient Stated Goals determine what's causing the verigo    Currently in Pain? No/denies              [facility] PT Assessment - 01/25/20 0001      Assessment   Medical Diagnosis Vertigo    Referring Provider (PT) Dr. Newman Pies    Onset Date/Surgical Date 01/20/20   2018 initial episode   Prior Therapy pt states she received tx for vertigo when she was at [facility]      Precautions   Precautions None      Balance Screen   Has the patient fallen in the past 6 months Yes    How many times? 1    Has the patient had a decrease in activity level because of a fear of falling?  No  Is the patient reluctant to leave their home because of a fear of falling?  No                  Vestibular Assessment - 01/25/20 0001      Symptom Behavior   Subjective history of current problem pt reports no vertigo at this time    Type of Dizziness  Spinning    Frequency of Dizziness none at this time    Duration of Dizziness secs to minutes    Symptom Nature Positional;Motion provoked    Aggravating Factors Activity in general;Turning head quickly    Relieving Factors Rest    Progression of Symptoms Better    History of similar episodes in 2018 ; recently on 01-19-20      Oculomotor Exam   Oculomotor Alignment Normal    Spontaneous Absent      Positional Testing   Dix-Hallpike Dix-Hallpike Right;Dix-Hallpike Left    Sidelying Test Sidelying Right;Sidelying Left      Dix-Hallpike Right   Dix-Hallpike Right Duration none    Dix-Hallpike Right Symptoms No nystagmus      Dix-Hallpike Left   Dix-Hallpike Left Duration none    Dix-Hallpike Left Symptoms No  nystagmus      Sidelying Right   Sidelying Right Duration none    Sidelying Right Symptoms No nystagmus      Sidelying Left   Sidelying Left Duration none    Sidelying Left Symptoms No nystagmus              Objective measurements completed on examination: See above findings.               PT Education - 01/25/20 1324    Education Details pt requests instruction in balance HEP and also in tx for BPPV if it should re-occur    Person(s) Educated Patient;Child(ren)    Methods Explanation;Demonstration;Handout    Comprehension Verbalized understanding;Returned demonstration                 EVAL only per pt's request       Plan - 01/25/20 1327    Clinical Impression Statement Pt has no signs or symptoms of vertigo at this time; pt is not interested in participating in an on-going PT program, she she states she is unable to drive herself to PT on [address].  Pt is requesitng a HEP for balance and exercises to treat the vertigo should it re-occur.  Daughter present during session and instructed in this HEP and also in Epley maneuver should BPPV re-occur.    Personal Factors and Comorbidities Comorbidity 2;Behavior Pattern;Transportation    Comorbidities OA, glaucoma, osteoporosis, HTN, depression and anxiety, bil. hearing loss (high frequency)    Examination-Activity Limitations Locomotion Level;Reach Overhead;Bend    Examination-Participation Restrictions Cleaning;Shop;Community Activity;Meal Prep    Stability/Clinical Decision Making Evolving/Moderate complexity    PT Frequency One time visit   per pt's request   PT Treatment/Interventions Canalith Repostioning;Neuromuscular re-education;ADLs/Self Care Home Management;Vestibular;Therapeutic exercise;Balance training;Patient/family education;Gait training;Therapeutic activities    PT Next Visit Plan N/A - pt is not scheduled to return per her request -- wanted HEP only    Consulted and Agree with Plan of Care  Patient;Family member/caregiver    Family Member Consulted daughter Lelon Mast           Patient will benefit from skilled therapeutic intervention in order to improve the following deficits and impairments:  Dizziness  Visit Diagnosis: Dizziness and giddiness - Plan: PT plan of care cert/re-cert  Unsteadiness on feet - Plan: PT plan of care cert/re-cert     Problem List There are no problems to display for this patient.   EXNTZG, [id number] [id number], PT 01/25/2020, 1:36 PM  [facility] [address] [place], [place], [address] Phone: [phone]   Fax:  [phone]  Name: Holly Conley MRN: [id number] Date of Birth: 08-Mar-1936

## 2020-02-04 DIAGNOSIS — E785 Hyperlipidemia, unspecified: Secondary | ICD-10-CM | POA: Diagnosis not present

## 2020-02-04 DIAGNOSIS — E7800 Pure hypercholesterolemia, unspecified: Secondary | ICD-10-CM | POA: Diagnosis not present

## 2020-02-04 DIAGNOSIS — I10 Essential (primary) hypertension: Secondary | ICD-10-CM | POA: Diagnosis not present

## 2020-02-04 DIAGNOSIS — H4089 Other specified glaucoma: Secondary | ICD-10-CM | POA: Diagnosis not present

## 2020-03-10 ENCOUNTER — Other Ambulatory Visit: Payer: Self-pay | Admitting: Family Medicine

## 2020-03-10 DIAGNOSIS — Z1231 Encounter for screening mammogram for malignant neoplasm of breast: Secondary | ICD-10-CM

## 2020-03-11 DIAGNOSIS — L821 Other seborrheic keratosis: Secondary | ICD-10-CM | POA: Diagnosis not present

## 2020-03-11 DIAGNOSIS — L578 Other skin changes due to chronic exposure to nonionizing radiation: Secondary | ICD-10-CM | POA: Diagnosis not present

## 2020-03-11 DIAGNOSIS — D2230 Melanocytic nevi of unspecified part of face: Secondary | ICD-10-CM | POA: Diagnosis not present

## 2020-03-11 DIAGNOSIS — Z808 Family history of malignant neoplasm of other organs or systems: Secondary | ICD-10-CM | POA: Diagnosis not present

## 2020-04-01 DIAGNOSIS — Z0000 Encounter for general adult medical examination without abnormal findings: Secondary | ICD-10-CM | POA: Diagnosis not present

## 2020-04-01 DIAGNOSIS — R7303 Prediabetes: Secondary | ICD-10-CM | POA: Diagnosis not present

## 2020-04-01 DIAGNOSIS — R0789 Other chest pain: Secondary | ICD-10-CM | POA: Diagnosis not present

## 2020-04-01 DIAGNOSIS — E7800 Pure hypercholesterolemia, unspecified: Secondary | ICD-10-CM | POA: Diagnosis not present

## 2020-04-09 ENCOUNTER — Other Ambulatory Visit: Payer: Self-pay

## 2020-04-09 ENCOUNTER — Ambulatory Visit
Admission: RE | Admit: 2020-04-09 | Discharge: 2020-04-09 | Disposition: A | Discharge status: Home / Self Care | Payer: Medicare Other | Source: Ambulatory Visit | Attending: Family Medicine | Admitting: Family Medicine

## 2020-04-09 DIAGNOSIS — Z1231 Encounter for screening mammogram for malignant neoplasm of breast: Secondary | ICD-10-CM | POA: Diagnosis not present

## 2020-04-10 ENCOUNTER — Other Ambulatory Visit: Payer: Self-pay | Admitting: Family Medicine

## 2020-04-10 DIAGNOSIS — Z1231 Encounter for screening mammogram for malignant neoplasm of breast: Secondary | ICD-10-CM

## 2020-07-22 DIAGNOSIS — H04123 Dry eye syndrome of bilateral lacrimal glands: Secondary | ICD-10-CM | POA: Diagnosis not present

## 2020-07-22 DIAGNOSIS — H401212 Low-tension glaucoma, right eye, moderate stage: Secondary | ICD-10-CM | POA: Diagnosis not present

## 2020-07-22 DIAGNOSIS — H26491 Other secondary cataract, right eye: Secondary | ICD-10-CM | POA: Diagnosis not present

## 2020-09-09 DIAGNOSIS — G4700 Insomnia, unspecified: Secondary | ICD-10-CM | POA: Diagnosis not present

## 2020-09-09 DIAGNOSIS — E7800 Pure hypercholesterolemia, unspecified: Secondary | ICD-10-CM | POA: Diagnosis not present

## 2020-09-09 DIAGNOSIS — K219 Gastro-esophageal reflux disease without esophagitis: Secondary | ICD-10-CM | POA: Diagnosis not present

## 2020-09-09 DIAGNOSIS — R7303 Prediabetes: Secondary | ICD-10-CM | POA: Diagnosis not present

## 2021-01-21 DIAGNOSIS — H401212 Low-tension glaucoma, right eye, moderate stage: Secondary | ICD-10-CM | POA: Diagnosis not present

## 2021-01-21 DIAGNOSIS — H0100A Unspecified blepharitis right eye, upper and lower eyelids: Secondary | ICD-10-CM | POA: Diagnosis not present

## 2021-01-21 DIAGNOSIS — H0100B Unspecified blepharitis left eye, upper and lower eyelids: Secondary | ICD-10-CM | POA: Diagnosis not present

## 2021-03-11 DIAGNOSIS — Z808 Family history of malignant neoplasm of other organs or systems: Secondary | ICD-10-CM | POA: Diagnosis not present

## 2021-03-11 DIAGNOSIS — L578 Other skin changes due to chronic exposure to nonionizing radiation: Secondary | ICD-10-CM | POA: Diagnosis not present

## 2021-03-11 DIAGNOSIS — D2230 Melanocytic nevi of unspecified part of face: Secondary | ICD-10-CM | POA: Diagnosis not present

## 2021-03-11 DIAGNOSIS — L821 Other seborrheic keratosis: Secondary | ICD-10-CM | POA: Diagnosis not present

## 2021-03-17 DIAGNOSIS — E7800 Pure hypercholesterolemia, unspecified: Secondary | ICD-10-CM | POA: Diagnosis not present

## 2021-03-17 DIAGNOSIS — Z0000 Encounter for general adult medical examination without abnormal findings: Secondary | ICD-10-CM | POA: Diagnosis not present

## 2021-03-17 DIAGNOSIS — I10 Essential (primary) hypertension: Secondary | ICD-10-CM | POA: Diagnosis not present

## 2021-03-17 DIAGNOSIS — R0789 Other chest pain: Secondary | ICD-10-CM | POA: Diagnosis not present

## 2021-03-17 DIAGNOSIS — R7303 Prediabetes: Secondary | ICD-10-CM | POA: Diagnosis not present

## 2021-03-20 ENCOUNTER — Other Ambulatory Visit: Payer: Self-pay | Admitting: Family Medicine

## 2021-03-20 DIAGNOSIS — M810 Age-related osteoporosis without current pathological fracture: Secondary | ICD-10-CM

## 2021-04-10 ENCOUNTER — Ambulatory Visit
Admission: RE | Admit: 2021-04-10 | Discharge: 2021-04-10 | Disposition: A | Discharge status: Home / Self Care | Payer: Medicare Other | Source: Ambulatory Visit | Attending: Family Medicine | Admitting: Family Medicine

## 2021-04-10 ENCOUNTER — Other Ambulatory Visit: Payer: Self-pay

## 2021-04-10 DIAGNOSIS — Z1231 Encounter for screening mammogram for malignant neoplasm of breast: Secondary | ICD-10-CM

## 2021-04-14 DIAGNOSIS — H401212 Low-tension glaucoma, right eye, moderate stage: Secondary | ICD-10-CM | POA: Diagnosis not present

## 2021-04-14 DIAGNOSIS — H0100B Unspecified blepharitis left eye, upper and lower eyelids: Secondary | ICD-10-CM | POA: Diagnosis not present

## 2021-04-14 DIAGNOSIS — H0100A Unspecified blepharitis right eye, upper and lower eyelids: Secondary | ICD-10-CM | POA: Diagnosis not present

## 2021-05-07 ENCOUNTER — Ambulatory Visit
Admission: RE | Admit: 2021-05-07 | Discharge: 2021-05-07 | Disposition: A | Discharge status: Home / Self Care | Payer: Medicare Other | Source: Ambulatory Visit | Attending: Family Medicine | Admitting: Family Medicine

## 2021-05-07 ENCOUNTER — Other Ambulatory Visit: Payer: Self-pay

## 2021-05-07 DIAGNOSIS — Z780 Asymptomatic menopausal state: Secondary | ICD-10-CM | POA: Diagnosis not present

## 2021-05-07 DIAGNOSIS — M810 Age-related osteoporosis without current pathological fracture: Secondary | ICD-10-CM | POA: Diagnosis not present

## 2021-08-24 DIAGNOSIS — H04123 Dry eye syndrome of bilateral lacrimal glands: Secondary | ICD-10-CM | POA: Diagnosis not present

## 2021-08-24 DIAGNOSIS — H401232 Low-tension glaucoma, bilateral, moderate stage: Secondary | ICD-10-CM | POA: Diagnosis not present

## 2021-08-24 DIAGNOSIS — H0100A Unspecified blepharitis right eye, upper and lower eyelids: Secondary | ICD-10-CM | POA: Diagnosis not present

## 2021-09-30 DIAGNOSIS — R42 Dizziness and giddiness: Secondary | ICD-10-CM | POA: Diagnosis not present

## 2021-09-30 DIAGNOSIS — E7800 Pure hypercholesterolemia, unspecified: Secondary | ICD-10-CM | POA: Diagnosis not present

## 2021-09-30 DIAGNOSIS — K219 Gastro-esophageal reflux disease without esophagitis: Secondary | ICD-10-CM | POA: Diagnosis not present

## 2021-09-30 DIAGNOSIS — R7303 Prediabetes: Secondary | ICD-10-CM | POA: Diagnosis not present

## 2021-10-28 ENCOUNTER — Telehealth: Payer: Self-pay

## 2021-10-28 NOTE — Telephone Encounter (Signed)
NOTES SCANNED TO REFERRAL 

## 2021-11-10 NOTE — Progress Notes (Signed)
Holly Dopp, MD Reason for referral-chest pain  HPI: 86 year old female for evaluation of chest pain at request of Shirline Frees, MD.  ABIs January 2019 normal; bilateral toe brachial indices abnormal.  Patient states that in the past few months that she has had intermittent chest pain.  It is in the substernal area without radiation.  No associated symptoms.  It is typically associated with stress and last 5 to 10 minutes and resolve spontaneously.  It is not pleuritic.  There is a question of whether it increases with food.  She otherwise denies dyspnea on exertion, orthopnea, PND, pedal edema, exertional chest pain or syncope.  Cardiology now asked to evaluate.  Current Outpatient Medications  Medication Sig Dispense Refill   CALCIUM-MAGNESIUM-ZINC PO Take 1 tablet by mouth daily.     cholecalciferol (VITAMIN D) 1000 UNITS tablet Take 2,000 Units by mouth 2 (two) times daily.      cyclobenzaprine (FLEXERIL) 10 MG tablet Take 10 mg by mouth 3 (three) times daily as needed for muscle spasms.     diazepam (VALIUM) 10 MG tablet Take 10 mg by mouth every 6 (six) hours as needed for anxiety.     famotidine (PEPCID) 20 MG tablet 1 tablet as needed     Garlic [id number] MG CAPS See admin instructions.     ibuprofen (ADVIL) 200 MG tablet 1 tablet with food or milk as needed     latanoprost (XALATAN) 0.005 % ophthalmic solution Place 1 drop into both eyes at bedtime.   1   Multiple Vitamin (MULTIVITAMIN) tablet Take 1 tablet by mouth daily.     Polyethyl Glycol-Propyl Glycol (SYSTANE) 0.4-0.3 % SOLN Apply to eye as needed.     polyethylene glycol (MIRALAX / GLYCOLAX) packet Take 17 g by mouth daily.      Probiotic Product (PROBIOTIC ADVANCED PO) Take 1 capsule by mouth daily.     Propylene Glycol (SYSTANE BALANCE OP) Apply to eye.     ranitidine (ZANTAC) 300 MG tablet Take 300 mg by mouth 2 (two) times daily.  0   timolol (BETIMOL) 0.25 % ophthalmic solution Place 1 drop into both eyes  daily.     timolol (TIMOPTIC) 0.25 % ophthalmic solution Administer 1 drop to both eyes daily.     aspirin EC 81 MG tablet Take 81 mg by mouth daily.     No current facility-administered medications for this visit.    Allergies  Allergen Reactions   Codeine     nauseated   Morphine And Related     nauseated   Penicillins      Past Medical History:  Diagnosis Date   Anxiety    Arthritis    knees and hands   Colon polyps    Dizziness    DJD (degenerative joint disease)    GERD (gastroesophageal reflux disease)    Glaucoma    Hyperlipidemia    Hypertension    Insomnia    Multiple allergies    Osteopenia    Osteoporosis    R and L hip   Panic attacks    Paresthesias    Prediabetes    PVC (premature ventricular contraction)    Restless leg syndrome    Thrombocytopenia (HCC)    Vertigo    BPPV   Vitamin D deficiency     Past Surgical History:  Procedure Laterality Date   APPENDECTOMY     CATARACT EXTRACTION, BILATERAL     closed reduction Right    foot  DILATION AND CURETTAGE OF UTERUS     FOOT SURGERY Left    titanium L ankle   TONSILLECTOMY AND ADENOIDECTOMY     TUBAL LIGATION     VAGINAL HYSTERECTOMY      Social History   Socioeconomic History   Marital status: Widowed    Spouse name: Not on file   Number of children: 3   Years of education: Not on file   Highest education level: Not on file  Occupational History   Not on file  Tobacco Use   Smoking status: Never   Smokeless tobacco: Never  Substance and Sexual Activity   Alcohol use: Yes    Comment: socially   Drug use: No   Sexual activity: Not on file  Other Topics Concern   Not on file  Social History Narrative   Lives home alone.  Widowed.  Education: Emergency planning/management officer.  Children 3.     Social Determinants of Health   Financial Resource Strain: Not on file  Food Insecurity: Not on file  Transportation Needs: Not on file  Physical Activity: Not on file  Stress: Not on file  Social  Connections: Not on file  Intimate Partner Violence: Not on file    Family History  Problem Relation Age of Onset   Bladder Cancer Mother    Diabetes Mellitus I Mother    Coronary artery disease Mother    Hypertension Mother    Heart failure Father    Heart disease Father    Hypertension Father    Colon polyps Father    Hyperlipidemia Sister    Hypertension Sister    Colon polyps Sister     ROS: no fevers or chills, productive cough, hemoptysis, dysphasia, odynophagia, melena, hematochezia, dysuria, hematuria, rash, seizure activity, orthopnea, PND, pedal edema, claudication. Remaining systems are negative.  Physical Exam:   Blood pressure (!) 167/90, pulse (!) 56, height 5' (1.524 m), weight 128 lb (58.1 kg), SpO2 97 %.  General:  Well developed/well nourished in NAD Skin warm/dry Patient not depressed No peripheral clubbing Back-normal HEENT-normal/normal eyelids Neck supple/normal carotid upstroke bilaterally; no bruits; no JVD; no thyromegaly chest - CTA/ normal expansion CV - RRR/normal S1 and S2; no murmurs, rubs or gallops;  PMI nondisplaced Abdomen -NT/ND, no HSM, no mass, + bowel sounds, no bruit 2+ femoral pulses, no bruits Ext-no edema, chords, 2+ DP Neuro-grossly nonfocal  ECG -sinus bradycardia at a rate of 56, inferior lateral T wave inversion.  Findings similar to 01/20/2019.  Personally reviewed  A/P  1 chest pain-symptoms are atypical.  Electrocardiogram shows inferolateral T wave inversion but is unchanged.  We will arrange a cardiac CTA to rule out obstructive coronary disease.  2 hypertension-blood pressure is elevated but she states it typically is in the 120/70 range.  We will follow and adjust if needed.  3 hyperlipidemia-we will await results of cardiac CTA.  If abnormal would recommend statin therapy.  Kirk Ruths, MD

## 2021-11-13 ENCOUNTER — Ambulatory Visit: Payer: Medicare Other | Admitting: Cardiology

## 2021-11-13 ENCOUNTER — Encounter: Payer: Self-pay | Admitting: Cardiology

## 2021-11-13 ENCOUNTER — Ambulatory Visit: Payer: Medicare Other | Admitting: Cardiovascular Disease

## 2021-11-13 VITALS — BP 167/90 | HR 56 | Ht 60.0 in | Wt 128.0 lb

## 2021-11-13 DIAGNOSIS — R072 Precordial pain: Secondary | ICD-10-CM | POA: Diagnosis not present

## 2021-11-13 DIAGNOSIS — E7800 Pure hypercholesterolemia, unspecified: Secondary | ICD-10-CM | POA: Diagnosis not present

## 2021-11-13 DIAGNOSIS — I10 Essential (primary) hypertension: Secondary | ICD-10-CM

## 2021-11-13 MED ORDER — METOPROLOL TARTRATE 25 MG PO TABS: ORAL_TABLET | ORAL | 0 refills | Status: DC

## 2021-11-13 NOTE — Patient Instructions (Signed)
  Testing/Procedures:    Your cardiac CT will be scheduled at   [facility] [place], [place] [address] [phone]    If scheduled at [facility], please arrive at the [facility] and Children's Entrance (Entrance C2) of [facility] 30 minutes prior to test start time. You can use the FREE valet parking offered at entrance C (encouraged to control the heart rate for the test)  Proceed to the [facility] Radiology Department (first floor) to check-in and test prep.  All radiology patients and guests should use entrance C2 at [facility], accessed from [facility], even though the hospital's physical address listed is [address].     Please follow these instructions carefully (unless otherwise directed):   On the Night Before the Test: Be sure to Drink plenty of water. Do not consume any caffeinated/decaffeinated beverages or chocolate 12 hours prior to your test. Do not take any antihistamines 12 hours prior to your test.   On the Day of the Test: Drink plenty of water until 1 hour prior to the test. Do not eat any food 4 hours prior to the test. You may take your regular medications prior to the test.  Take metoprolol (Lopressor) 25 mg two hours prior to test. FEMALES- please wear underwire-free bra if available, avoid dresses & tight clothing       After the Test: Drink plenty of water. After receiving IV contrast, you may experience a mild flushed feeling. This is normal. On occasion, you may experience a mild rash up to 24 hours after the test. This is not dangerous. If this occurs, you can take Benadryl 25 mg and increase your fluid intake. If you experience trouble breathing, this can be serious. If it is severe call 911 IMMEDIATELY. If it is mild, please call our office.   We will call to schedule your test 2-4 weeks out understanding that some insurance companies will need an authorization  prior to the service being performed.   For non-scheduling related questions, please contact the cardiac imaging nurse navigator should you have any questions/concerns: Marchia Bond, Cardiac Imaging Nurse Navigator Gordy Clement, Cardiac Imaging Nurse Navigator [facility] Direct Office Dial: [phone]   For scheduling needs, including cancellations and rescheduling, please call [place], [phone].    Follow-Up: At [facility], you and your health needs are our priority.  As part of our continuing mission to provide you with exceptional heart care, we have created designated Provider Care Teams.  These Care Teams include your primary Cardiologist (physician) and Advanced Practice Providers (APPs -  Physician Assistants and Nurse Practitioners) who all work together to provide you with the care you need, when you need it.  We recommend signing up for the patient portal called "MyChart".  Sign up information is provided on this After Visit Summary.  MyChart is used to connect with patients for Virtual Visits (Telemedicine).  Patients are able to view lab/test results, encounter notes, upcoming appointments, etc.  Non-urgent messages can be sent to your provider as well.   To learn more about what you can do with MyChart, go to [email or web address].    Your next appointment:   12 month(s)  The format for your next appointment:   In Person  Provider:   Kirk Ruths MD      Important Information About Sugar

## 2021-11-16 ENCOUNTER — Telehealth: Payer: Self-pay | Admitting: Cardiology

## 2021-11-16 MED ORDER — METOPROLOL TARTRATE 25 MG PO TABS: ORAL_TABLET | ORAL | 0 refills | Status: DC

## 2021-11-16 NOTE — Telephone Encounter (Signed)
Pt c/o medication issue:  1. Name of Medication:   metoprolol tartrate (LOPRESSOR) 25 MG tablet    2. How are you currently taking this medication (dosage and times per day)? Take 2 hours prior to CT scan  3. Are you having a reaction (difficulty breathing--STAT)? No  4. What is your medication issue? Pt would like to know why she needs to take this medication. Please advise

## 2021-11-16 NOTE — Telephone Encounter (Signed)
The patient has a couple concerns. She feels she should not need the metoprolol because her heart rate is slow.  Anytime she is sitting it dips to high 50s per her fit bit.  I am going to resend it through her mail order pharmacy because she doesn't like to drive to [facility].  She is going to monitor and will speak with the imaging navigator w her readings when her test date gets closer.  She also wants to know if this has been authorized by her insurance.  She has placed a call to [place] before calling our office.  We talked a lot about her blood pressure reading at the office that day and readings she gets at home.  She will continue to monitor this as well.

## 2021-11-17 NOTE — Telephone Encounter (Signed)
Left message on personal and identified voicemail regarding preauth for ccta.

## 2021-11-17 NOTE — Telephone Encounter (Signed)
Pt wants to make sure cCTA is authorized if needed.

## 2021-11-19 DIAGNOSIS — W57XXXA Bitten or stung by nonvenomous insect and other nonvenomous arthropods, initial encounter: Secondary | ICD-10-CM | POA: Diagnosis not present

## 2021-11-19 DIAGNOSIS — S1086XA Insect bite of other specified part of neck, initial encounter: Secondary | ICD-10-CM | POA: Diagnosis not present

## 2021-11-30 ENCOUNTER — Telehealth: Payer: Self-pay | Admitting: Cardiology

## 2021-11-30 NOTE — Telephone Encounter (Signed)
Patient states her prior authorization for her CT is still pending. She states she spoke with Eber Jones from the pre authorization department of [employer] [facility] who states she would need a peer to peer with Dr. Jens Som to expedite it. She states the number is: [phone] and to request Eber Jones in the pre authorization department.

## 2021-12-01 NOTE — Telephone Encounter (Signed)
Left message for patient prior authorization for CT scan has been completed.

## 2021-12-02 ENCOUNTER — Telehealth (HOSPITAL_COMMUNITY): Payer: Self-pay | Admitting: *Deleted

## 2021-12-02 NOTE — Telephone Encounter (Signed)
Reaching out to patient to offer assistance regarding upcoming cardiac imaging study; pt verbalizes understanding of appt date/time, parking situation and where to check in, pre-test NPO status, and verified current allergies; name and call back number provided for further questions should they arise  Leyton Brownlee RN Navigator Cardiac Imaging [facility] [phone] office [phone] cell  Patient aware to arrive at 1:30pm. 

## 2021-12-03 ENCOUNTER — Ambulatory Visit (HOSPITAL_BASED_OUTPATIENT_CLINIC_OR_DEPARTMENT_OTHER)
Admission: RE | Admit: 2021-12-03 | Discharge: 2021-12-03 | Disposition: A | Discharge status: Home / Self Care | Payer: Medicare Other | Source: Ambulatory Visit | Attending: Cardiology | Admitting: Cardiology

## 2021-12-03 ENCOUNTER — Ambulatory Visit (HOSPITAL_COMMUNITY)
Admission: RE | Admit: 2021-12-03 | Discharge: 2021-12-03 | Disposition: A | Discharge status: Home / Self Care | Payer: Medicare Other | Source: Ambulatory Visit | Attending: Cardiology | Admitting: Cardiology

## 2021-12-03 ENCOUNTER — Other Ambulatory Visit: Payer: Self-pay | Admitting: Cardiology

## 2021-12-03 DIAGNOSIS — R931 Abnormal findings on diagnostic imaging of heart and coronary circulation: Secondary | ICD-10-CM

## 2021-12-03 DIAGNOSIS — I2510 Atherosclerotic heart disease of native coronary artery without angina pectoris: Secondary | ICD-10-CM

## 2021-12-03 DIAGNOSIS — R072 Precordial pain: Secondary | ICD-10-CM | POA: Insufficient documentation

## 2021-12-03 MED ORDER — NITROGLYCERIN 0.4 MG SL SUBL
SUBLINGUAL_TABLET | SUBLINGUAL | Status: AC
  Filled 2021-12-03: qty 2

## 2021-12-03 MED ORDER — IOHEXOL 350 MG/ML SOLN
100.0000 mL | Freq: Once | INTRAVENOUS | Status: AC | PRN
  Administered 2021-12-03: 100 mL via INTRAVENOUS

## 2021-12-03 MED ORDER — NITROGLYCERIN 0.4 MG SL SUBL
0.8000 mg | SUBLINGUAL_TABLET | Freq: Once | SUBLINGUAL | Status: AC
  Administered 2021-12-03: 0.8 mg via SUBLINGUAL

## 2021-12-07 ENCOUNTER — Telehealth: Payer: Self-pay | Admitting: Cardiology

## 2021-12-07 NOTE — Telephone Encounter (Signed)
Follow up scheduled

## 2021-12-07 NOTE — Telephone Encounter (Signed)
Patient is wanting to talk with Dr. Jens Som or nurse regarding her results. Says that since Dr. Jens Som reviewed it, would like an explanation because of conflicting reports on it. Son is also on the phone as well

## 2021-12-07 NOTE — Telephone Encounter (Signed)
Spoke to patient she needs appointment with Dr.Crenshaw to discuss coronary ct results.Advised I will send message to Dr.Crenshaw's RN to schedule appointment.

## 2021-12-10 ENCOUNTER — Telehealth: Payer: Self-pay | Admitting: Cardiology

## 2021-12-10 NOTE — Telephone Encounter (Signed)
Spoke to patient she stated she would like a sooner appointment with Dr.Crenshaw to discuss coronary ct results.Stated she continues to be tired,cold.She has occasional chest pain.Stated symptoms are no worse.Advised I will send message to Dr.Crenshaw's RN to schedule sooner appointment if one becomes available.She refuses a PA appointment.

## 2021-12-10 NOTE — Telephone Encounter (Signed)
Pt states that she has been really tired and cold the last few days. Pt has a scheduled appt for next month but would like to been "worked in" if possible. Please advise

## 2021-12-16 DIAGNOSIS — H401232 Low-tension glaucoma, bilateral, moderate stage: Secondary | ICD-10-CM | POA: Diagnosis not present

## 2021-12-16 DIAGNOSIS — H0100B Unspecified blepharitis left eye, upper and lower eyelids: Secondary | ICD-10-CM | POA: Diagnosis not present

## 2021-12-16 DIAGNOSIS — H04123 Dry eye syndrome of bilateral lacrimal glands: Secondary | ICD-10-CM | POA: Diagnosis not present

## 2021-12-16 DIAGNOSIS — H0100A Unspecified blepharitis right eye, upper and lower eyelids: Secondary | ICD-10-CM | POA: Diagnosis not present

## 2021-12-21 ENCOUNTER — Encounter: Payer: Self-pay | Admitting: Cardiology

## 2021-12-21 NOTE — Telephone Encounter (Signed)
Error

## 2021-12-24 ENCOUNTER — Ambulatory Visit: Payer: Medicare Other | Admitting: Nurse Practitioner

## 2021-12-24 ENCOUNTER — Encounter: Payer: Self-pay | Admitting: Nurse Practitioner

## 2021-12-24 VITALS — BP 136/84 | HR 72 | Ht 60.0 in | Wt 131.0 lb

## 2021-12-24 DIAGNOSIS — I2510 Atherosclerotic heart disease of native coronary artery without angina pectoris: Secondary | ICD-10-CM | POA: Diagnosis not present

## 2021-12-24 DIAGNOSIS — R072 Precordial pain: Secondary | ICD-10-CM | POA: Diagnosis not present

## 2021-12-24 DIAGNOSIS — E785 Hyperlipidemia, unspecified: Secondary | ICD-10-CM

## 2021-12-24 DIAGNOSIS — I10 Essential (primary) hypertension: Secondary | ICD-10-CM

## 2021-12-24 MED ORDER — ROSUVASTATIN CALCIUM 20 MG PO TABS: 20.0000 mg | ORAL_TABLET | Freq: Every day | ORAL | 3 refills | Status: DC

## 2021-12-24 MED ORDER — NITROGLYCERIN 0.4 MG SL SUBL: 0.4000 mg | SUBLINGUAL_TABLET | SUBLINGUAL | 3 refills | Status: DC | PRN

## 2021-12-24 NOTE — Progress Notes (Signed)
Office Visit    Patient Name: Holly Conley Date of Encounter: 12/24/2021  Primary Care Provider:  Johny Blamer, MD Primary Cardiologist:  Olga Millers, MD  Chief Complaint    86 year old female with a history of CAD, intermittent chest pain, hypertension, hyperlipidemia, prediabetes, BPPV, thrombocytopenia, osteoporosis, GERD, DJD, and anxiety who presents for follow-up related to CAD and chest pain.  Past Medical History    Past Medical History:  Diagnosis Date   Anxiety    Arthritis    knees and hands   Colon polyps    Dizziness    DJD (degenerative joint disease)    GERD (gastroesophageal reflux disease)    Glaucoma    Hyperlipidemia    Hypertension    Insomnia    Multiple allergies    Osteopenia    Osteoporosis    R and L hip   Panic attacks    Paresthesias    Prediabetes    PVC (premature ventricular contraction)    Restless leg syndrome    Thrombocytopenia (HCC)    Vertigo    BPPV   Vitamin D deficiency    Past Surgical History:  Procedure Laterality Date   APPENDECTOMY     CATARACT EXTRACTION, BILATERAL     closed reduction Right    foot   DILATION AND CURETTAGE OF UTERUS     FOOT SURGERY Left    titanium L ankle   TONSILLECTOMY AND ADENOIDECTOMY     TUBAL LIGATION     VAGINAL HYSTERECTOMY      Allergies  Allergies  Allergen Reactions   Codeine     nauseated   Morphine And Related     nauseated   Penicillins     History of Present Illness    86 year old female with the above past medical history including CAD, intermittent chest pain, hypertension, hyperlipidemia, prediabetes, BPPV, thrombocytopenia, osteoporosis, GERD, DJD, and anxiety.  She was referred to Dr. Jens Som in June 2023 by her PCP in the setting of intermittent non-radiating substernal chest pain.  No other associated symptoms. ABIs in January 2019 were normal.  Coronary CTA in June 2023 revealed coronary calcium score of 14.5 (13 percentile for age, sex, and  race-matched controls), severe (70 to 99%) soft plaque stenosis in the mid LAD, FFR suggested mid LAD lesion was not flow-limiting.  She contacted our office 12/10/2021 with reports of ongoing fatigue and intermittent chest pain.  She presents today for follow-up accompanied by her daughter.  Since her last visit and since she contacted our office she has been stable cardiac standpoint.  She has noted occasional mid sternal chest discomfort which she describes as indigestion, she denies any exertional symptoms concerning for angina.  She has been active and has been working in her garden.  She is eager to discuss the results of her coronary CT angiogram.  She read the report on my chart and was concerned that she was going to need a heart catheterization- this is not something that she currently wishes to pursue.  Overall, she reports feeling well and denies any new concerns today.  Home Medications    Current Outpatient Medications  Medication Sig Dispense Refill   CALCIUM-MAGNESIUM-ZINC PO Take 1 tablet by mouth daily.     cholecalciferol (VITAMIN D) 1000 UNITS tablet Take 2,000 Units by mouth 2 (two) times daily.      cyclobenzaprine (FLEXERIL) 10 MG tablet Take 10 mg by mouth 3 (three) times daily as needed for muscle spasms.  diazepam (VALIUM) 10 MG tablet Take 10 mg by mouth every 6 (six) hours as needed for anxiety.     famotidine (PEPCID) 20 MG tablet 1 tablet as needed     Garlic 1000 MG CAPS See admin instructions.     ibuprofen (ADVIL) 200 MG tablet 1 tablet with food or milk as needed     latanoprost (XALATAN) 0.005 % ophthalmic solution Place 1 drop into both eyes at bedtime.   1   Multiple Vitamin (MULTIVITAMIN) tablet Take 1 tablet by mouth daily.     nitroGLYCERIN (NITROSTAT) 0.4 MG SL tablet Place 1 tablet (0.4 mg total) under the tongue every 5 (five) minutes as needed for chest pain. 90 tablet 3   Polyethyl Glycol-Propyl Glycol (SYSTANE) 0.4-0.3 % SOLN Apply to eye as needed.      polyethylene glycol (MIRALAX / GLYCOLAX) packet Take 17 g by mouth daily.      Probiotic Product (PROBIOTIC ADVANCED PO) Take 1 capsule by mouth daily.     Propylene Glycol (SYSTANE BALANCE OP) Apply to eye.     rosuvastatin (CRESTOR) 20 MG tablet Take 1 tablet (20 mg total) by mouth daily. 90 tablet 3   timolol (BETIMOL) 0.25 % ophthalmic solution Place 1 drop into both eyes daily.     timolol (TIMOPTIC) 0.25 % ophthalmic solution Administer 1 drop to both eyes daily.     No current facility-administered medications for this visit.     Review of Systems    She denies chest pain, palpitations, dyspnea, pnd, orthopnea, n, v, dizziness, syncope, edema, weight gain, or early satiety. All other systems reviewed and are otherwise negative except as noted above.   Physical Exam    VS:  BP 136/84   Pulse 72   Ht 5' (1.524 m)   Wt 131 lb (59.4 kg)   SpO2 97%   BMI 25.58 kg/m   GEN: Well nourished, well developed, in no acute distress. HEENT: normal. Neck: Supple, no JVD, carotid bruits, or masses. Cardiac: RRR, no murmurs, rubs, or gallops. No clubbing, cyanosis, edema.  Radials/DP/PT 2+ and equal bilaterally.  Respiratory:  Respirations regular and unlabored, clear to auscultation bilaterally. GI: Soft, nontender, nondistended, BS + x 4. MS: no deformity or atrophy. Skin: warm and dry, no rash. Neuro:  Strength and sensation are intact. Psych: Normal affect.  Accessory Clinical Findings    ECG personally reviewed by me today - No EKG in office today - no acute changes.  Lab Results  Component Value Date   WBC 4.3 01/20/2019   HGB 15.5 (H) 01/20/2019   HCT 45.8 01/20/2019   MCV 94.8 01/20/2019   PLT 145 (L) 01/20/2019   Lab Results  Component Value Date   CREATININE 0.79 01/20/2019   BUN 9 01/20/2019   NA 144 01/20/2019   K 4.2 01/20/2019   CL 108 01/20/2019   CO2 27 01/20/2019   Lab Results  Component Value Date   ALT 16 01/20/2019   AST 18 01/20/2019   ALKPHOS  45 01/20/2019   BILITOT 0.8 01/20/2019   No results found for: "CHOL", "HDL", "LDLCALC", "LDLDIRECT", "TRIG", "CHOLHDL"  No results found for: "HGBA1C"  Assessment & Plan     1. CAD/chest pain: Coronary CTA in June 2023 revealed coronary calcium score of 14.5 (13 percentile for age, sex, and race-matched controls), severe (70 to 99%) soft plaque stenosis in the mid LAD, FFR suggested mid LAD lesion was not flow-limiting.  She has intermittent nonspecific chest discomfort in  the midsternal area which she describes as indigestion.  She is not certain whether or not her symptoms are coming from her heart. Discussed possibility of cardiac catheterization, antianginal therapy, patient declines at this time.  Will provide prescription for nitroglycerin to use as needed.  Discussed emergency precautions.  Will start Crestor 20 mg daily for secondary prevention.  Continue aspirin 81 mg daily.  Continue to monitor symptoms.  2. Hypertension: BP well controlled. Continue current antihypertensive regimen.   3. Hyperlipidemia: LDL was 144 in April 2023.  Will start Crestor as above.  Repeat lipids, LFTs in 6 to 8 weeks.  4. Disposition: Follow-up in 6 weeks.   Joylene Grapes, NP 12/24/2021, 12:47 PM

## 2021-12-24 NOTE — Patient Instructions (Addendum)
Medication Instructions:  Crestor 20 mg daily Asprin 81 mg daily Nitroglycerin .4 mg take as needed for chest pain  *If you need a refill on your cardiac medications before your next appointment, please call your pharmacy*   Lab Work: NONE ordered at this time of appointment    If you have labs (blood work) drawn today and your tests are completely normal, you will receive your results only by: MyChart Message (if you have MyChart) OR A paper copy in the mail If you have any lab test that is abnormal or we need to change your treatment, we will call you to review the results.   Testing/Procedures: NONE ordered at this time of appointment     Follow-Up: At [facility], you and your health needs are our priority.  As part of our continuing mission to provide you with exceptional heart care, we have created designated Provider Care Teams.  These Care Teams include your primary Cardiologist (physician) and Advanced Practice Providers (APPs -  Physician Assistants and Nurse Practitioners) who all work together to provide you with the care you need, when you need it.  We recommend signing up for the patient portal called "MyChart".  Sign up information is provided on this After Visit Summary.  MyChart is used to connect with patients for Virtual Visits (Telemedicine).  Patients are able to view lab/test results, encounter notes, upcoming appointments, etc.  Non-urgent messages can be sent to your provider as well.   To learn more about what you can do with MyChart, go to [email or web address].    Your next appointment:   6 week(s)  The format for your next appointment:   In Person  Provider:   Bernadene Person, NP        Other Instructions   Important Information About Sugar

## 2021-12-28 ENCOUNTER — Telehealth: Payer: Self-pay | Admitting: Nurse Practitioner

## 2021-12-28 MED ORDER — NITROGLYCERIN 0.4 MG SL SUBL
0.4000 mg | SUBLINGUAL_TABLET | SUBLINGUAL | 11 refills | Status: DC | PRN
Start: 2021-12-28 — End: 2022-01-11

## 2021-12-28 NOTE — Telephone Encounter (Signed)
  Pt c/o medication issue:  1. Name of Medication:   nitroGLYCERIN (NITROSTAT) 0.4 MG SL tablet    2. How are you currently taking this medication (dosage and times per day)? Place 1 tablet (0.4 mg total) under the tongue every 5 (five) minutes as needed for chest pain.  3. Are you having a reaction (difficulty breathing--STAT)?   4. What is your medication issue? Pt said, [facility] wont fill her nitroglycerin because they need verification of the medication. Also, she said she still not yet receive her crestor. She wanted to know if she still need to see Irving Burton on 08/30 since she haven't started taking crestor yet

## 2021-12-28 NOTE — Telephone Encounter (Signed)
Returned call to patient left message on personal voice mail to call back. 

## 2021-12-28 NOTE — Telephone Encounter (Signed)
Spoke to patient she requested NTG refill # 25 tablets to be sent to mail order.Refill sent to [facility].

## 2021-12-28 NOTE — Telephone Encounter (Signed)
Pt c/o medication issue:  nitroGLYCERIN (NITROSTAT) 0.4 MG SL tablet   1. Name of Medication:   2. How are you currently taking this medication (dosage and times per day)? Place 1 tablet (0.4 mg total) under the tongue every 5 (five) minutes as needed for chest pain.  3. Are you having a reaction (difficulty breathing--STAT)? No  4. What is your medication issue? Pt states that she spoke with pharmacy and they are able to send the 25 tablets but they need a prescription reflecting that.    Pt also would like for nurse to return call regarding upcoming appt. Please advise

## 2021-12-28 NOTE — Telephone Encounter (Signed)
See previous 7/24 telephone note. 

## 2021-12-28 NOTE — Telephone Encounter (Signed)
Spoke to patient advised NTG refill was sent to mail order pharmacy.She requested 25 tablets instead of 90 tablets.She did not want 25 tablets sent to a local pharmacy.She will call [facility] to see if they send a smaller amount.

## 2021-12-29 ENCOUNTER — Telehealth: Payer: Self-pay | Admitting: Cardiology

## 2021-12-29 NOTE — Telephone Encounter (Signed)
Pt states she would like Dr. Tiburcio Pea, her PCP, to have a copy of her CT results.   She also wanted to let Bernadene Person know before her appt 02/04/22 that she started her crestor medication.

## 2021-12-29 NOTE — Telephone Encounter (Signed)
Called patient, advised I did fax over via epic the results of the CT to her PCP.   She also requested her message of starting Crestor today over to Bernadene Person, NP- I will send that information over as requested.   Thanks!

## 2022-01-11 ENCOUNTER — Telehealth: Payer: Self-pay | Admitting: Cardiology

## 2022-01-11 ENCOUNTER — Other Ambulatory Visit: Payer: Self-pay

## 2022-01-11 MED ORDER — NITROGLYCERIN 0.4 MG SL SUBL: 0.4000 mg | SUBLINGUAL_TABLET | SUBLINGUAL | 11 refills | Status: AC | PRN

## 2022-01-11 NOTE — Telephone Encounter (Signed)
Called [facility]-requested to put max of 3 doses in 15 mins and to call 911 if no relief, advised ok to add.    Left message making patient aware.

## 2022-01-11 NOTE — Telephone Encounter (Signed)
Pt c/o medication issue:  1. Name of Medication: nitroGLYCERIN (NITROSTAT) 0.4 MG SL tablet  2. How are you currently taking this medication (dosage and times per day)?  Place 1 tablet (0.4 mg total) under the tongue every 5 (five) minutes as needed for chest pain.  3. Are you having a reaction (difficulty breathing--STAT)? no  4. What is your medication issue? Pt called stating that [facility] needs clarification on instructions for medication.

## 2022-01-18 ENCOUNTER — Ambulatory Visit: Payer: Medicare Other | Admitting: Cardiology

## 2022-01-18 ENCOUNTER — Telehealth: Payer: Self-pay | Admitting: Cardiology

## 2022-01-18 NOTE — Telephone Encounter (Signed)
Called patient, advised of message below. Patient verbalized understanding.  

## 2022-01-18 NOTE — Telephone Encounter (Signed)
It doesn't sound like statin myalgia.  Usually it is more of an arthritis pain/ache and I've not had it ever come on suddenly.  Agree she needs to check with her ortho or PCP

## 2022-01-18 NOTE — Telephone Encounter (Signed)
  Pt c/o medication issue:  1. Name of Medication: rosuvastatin (CRESTOR) 20 MG tablet  2. How are you currently taking this medication (dosage and times per day)? Take 1 tablet (20 mg total) by mouth daily.  3. Are you having a reaction (difficulty breathing--STAT)? No   4. What is your medication issue? Pt said, she woke up with excruciating pain on her neck, she think its from her Crestor. She started taking it on 07/25. She said, she also trying to call her ortho doctor with no luck.

## 2022-01-18 NOTE — Telephone Encounter (Signed)
Called patient, she states she started Crestor on 07/25- she woke up this morning with excruciating pain in her neck, she was trying to figure out if this is related to her starting the Crestor. I did advise if it was excruciating pain it sounded like it was muscle pain, patient does state that it was better now. She is still trying to get in with her Ortho doctor, but she would like to make sure this was not the medication. I did advise I would check with our pharmacy team to verify their thoughts.

## 2022-01-22 DIAGNOSIS — M25552 Pain in left hip: Secondary | ICD-10-CM | POA: Diagnosis not present

## 2022-01-22 DIAGNOSIS — M25551 Pain in right hip: Secondary | ICD-10-CM | POA: Diagnosis not present

## 2022-01-22 DIAGNOSIS — M25561 Pain in right knee: Secondary | ICD-10-CM | POA: Diagnosis not present

## 2022-01-22 DIAGNOSIS — M25562 Pain in left knee: Secondary | ICD-10-CM | POA: Diagnosis not present

## 2022-02-03 NOTE — Progress Notes (Unsigned)
Office Visit    Patient Name: Holly Conley Date of Encounter: 02/04/2022  Primary Care Provider:  Johny Blamer, MD Primary Cardiologist:  Olga Millers, MD  Chief Complaint    86 year old female with a history of CAD, intermittent chest pain, hypertension, hyperlipidemia, prediabetes, BPPV, thrombocytopenia, osteoporosis, GERD, DJD, and anxiety who presents for follow-up related to CAD and chest pain.  Past Medical History    Past Medical History:  Diagnosis Date   Anxiety    Arthritis    knees and hands   Colon polyps    Dizziness    DJD (degenerative joint disease)    GERD (gastroesophageal reflux disease)    Glaucoma    Hyperlipidemia    Hypertension    Insomnia    Multiple allergies    Osteopenia    Osteoporosis    R and L hip   Panic attacks    Paresthesias    Prediabetes    PVC (premature ventricular contraction)    Restless leg syndrome    Thrombocytopenia (HCC)    Vertigo    BPPV   Vitamin D deficiency    Past Surgical History:  Procedure Laterality Date   APPENDECTOMY     CATARACT EXTRACTION, BILATERAL     closed reduction Right    foot   DILATION AND CURETTAGE OF UTERUS     FOOT SURGERY Left    titanium L ankle   TONSILLECTOMY AND ADENOIDECTOMY     TUBAL LIGATION     VAGINAL HYSTERECTOMY      Allergies  Allergies  Allergen Reactions   Codeine     nauseated   Morphine And Related     nauseated   Penicillins     History of Present Illness    86 year old female with the above past medical history including CAD, intermittent chest pain, hypertension, hyperlipidemia, prediabetes, BPPV, thrombocytopenia, osteoporosis, GERD, DJD, and anxiety.   She was referred to Dr. Jens Som in June 2023 by her PCP in the setting of intermittent non-radiating substernal chest pain.  No other associated symptoms. ABIs in January 2019 were normal.  Coronary CTA in June 2023 revealed coronary calcium score of 14.5 (13 percentile for age, sex, and  race-matched controls), severe (70 to 99%) soft plaque stenosis in the mid LAD, FFR suggested mid LAD lesion was not flow-limiting. She contacted our office 12/10/2021 with reports of ongoing fatigue and intermittent chest pain.  She was last seen in the office on 12/24/2021 was stable from a cardiac standpoint.  She did note occasional midsternal chest discomfort, she denies exertional symptoms.  Furthermore, she declined cardiac cardiac catheterization.    She presents today for follow-up accompanied by her daughter.  Since her last visit she had done well from a cardiac standpoint. She denies any recent chest discomfort, dyspnea. She notes occasional mild dizziness, however this has been ongoing for a long time.  She denies palpitations, presyncope, or syncope.  She is active and works regularly in her garden.  She denies any symptoms concerning for angina.  Overall, she reports feeling well and denies any new concerns today.  Home Medications    Current Outpatient Medications  Medication Sig Dispense Refill   CALCIUM-MAGNESIUM-ZINC PO Take 1 tablet by mouth daily.     cholecalciferol (VITAMIN D) 1000 UNITS tablet Take 2,000 Units by mouth 2 (two) times daily.      cyclobenzaprine (FLEXERIL) 10 MG tablet Take 10 mg by mouth 3 (three) times daily as needed for muscle spasms.  diazepam (VALIUM) 10 MG tablet Take 10 mg by mouth every 6 (six) hours as needed for anxiety.     famotidine (PEPCID) 20 MG tablet 1 tablet as needed     Garlic 1000 MG CAPS See admin instructions.     ibuprofen (ADVIL) 200 MG tablet 1 tablet with food or milk as needed     latanoprost (XALATAN) 0.005 % ophthalmic solution Place 1 drop into both eyes at bedtime.   1   Multiple Vitamin (MULTIVITAMIN) tablet Take 1 tablet by mouth daily.     nitroGLYCERIN (NITROSTAT) 0.4 MG SL tablet Place 1 tablet (0.4 mg total) under the tongue every 5 (five) minutes as needed for chest pain. 25 tablet 11   Polyethyl Glycol-Propyl Glycol  (SYSTANE) 0.4-0.3 % SOLN Apply to eye as needed.     polyethylene glycol (MIRALAX / GLYCOLAX) packet Take 17 g by mouth daily.      Probiotic Product (PROBIOTIC ADVANCED PO) Take 1 capsule by mouth daily.     Propylene Glycol (SYSTANE BALANCE OP) Apply to eye.     rosuvastatin (CRESTOR) 20 MG tablet Take 1 tablet (20 mg total) by mouth daily. 90 tablet 3   timolol (BETIMOL) 0.25 % ophthalmic solution Place 1 drop into both eyes daily.     timolol (TIMOPTIC) 0.25 % ophthalmic solution Administer 1 drop to both eyes daily.     No current facility-administered medications for this visit.     Review of Systems    She denies chest pain, palpitations, dyspnea, pnd, orthopnea, n, v, dizziness, syncope, edema, weight gain, or early satiety. All other systems reviewed and are otherwise negative except as noted above.   Physical Exam    VS:  BP 122/78   Pulse 61   Ht 5\' 1"  (1.549 m)   Wt 130 lb (59 kg)   SpO2 96%   BMI 24.56 kg/m   GEN: Well nourished, well developed, in no acute distress. HEENT: normal. Neck: Supple, no JVD, carotid bruits, or masses. Cardiac: RRR, no murmurs, rubs, or gallops. No clubbing, cyanosis, edema.  Radials/DP/PT 2+ and equal bilaterally.  Respiratory:  Respirations regular and unlabored, clear to auscultation bilaterally. GI: Soft, nontender, nondistended, BS + x 4. MS: no deformity or atrophy. Skin: warm and dry, no rash. Neuro:  Strength and sensation are intact. Psych: Normal affect.  Accessory Clinical Findings    ECG personally reviewed by me today - No EKG in office today.     Lab Results  Component Value Date   WBC 4.3 01/20/2019   HGB 15.5 (H) 01/20/2019   HCT 45.8 01/20/2019   MCV 94.8 01/20/2019   PLT 145 (L) 01/20/2019   Lab Results  Component Value Date   CREATININE 0.79 01/20/2019   BUN 9 01/20/2019   NA 144 01/20/2019   K 4.2 01/20/2019   CL 108 01/20/2019   CO2 27 01/20/2019   Lab Results  Component Value Date   ALT 16  01/20/2019   AST 18 01/20/2019   ALKPHOS 45 01/20/2019   BILITOT 0.8 01/20/2019   No results found for: "CHOL", "HDL", "LDLCALC", "LDLDIRECT", "TRIG", "CHOLHDL"  No results found for: "HGBA1C"  Assessment & Plan    1. CAD/chest pain: Coronary CTA in June 2023 revealed coronary calcium score of 14.5 (13 percentile for age, sex, and race-matched controls), severe (70 to 99%) soft plaque stenosis in the mid LAD, FFR suggested mid LAD lesion was not flow-limiting.  She thinks her prior symptoms were related to  GERD. Previously declined cath, declined antianginal therapy. Stable with no anginal symptoms. No indication for ischemic evaluation. Continue Crestor.  2. Hypertension: BP well controlled. Continue current antihypertensive regimen.   3. Hyperlipidemia: LDL was 144 in April 2023. Recently started on Crestor.  Will check lipids, LFTs.  Continue Crestor.   4. Disposition: Follow-up in 6 months.       Joylene Grapes, NP 02/04/2022, 10:01 AM

## 2022-02-04 ENCOUNTER — Other Ambulatory Visit: Payer: Self-pay

## 2022-02-04 ENCOUNTER — Ambulatory Visit: Payer: Medicare Other | Attending: Nurse Practitioner | Admitting: Nurse Practitioner

## 2022-02-04 ENCOUNTER — Encounter: Payer: Self-pay | Admitting: Nurse Practitioner

## 2022-02-04 VITALS — BP 122/78 | HR 61 | Ht 61.0 in | Wt 130.0 lb

## 2022-02-04 DIAGNOSIS — I25118 Atherosclerotic heart disease of native coronary artery with other forms of angina pectoris: Secondary | ICD-10-CM | POA: Diagnosis not present

## 2022-02-04 DIAGNOSIS — I10 Essential (primary) hypertension: Secondary | ICD-10-CM | POA: Diagnosis not present

## 2022-02-04 DIAGNOSIS — E785 Hyperlipidemia, unspecified: Secondary | ICD-10-CM | POA: Diagnosis not present

## 2022-02-04 DIAGNOSIS — R072 Precordial pain: Secondary | ICD-10-CM | POA: Diagnosis not present

## 2022-02-04 LAB — HEPATIC FUNCTION PANEL
ALT: 17 IU/L (ref 0–32)
AST: 19 IU/L (ref 0–40)
Albumin: 4.6 g/dL (ref 3.7–4.7)
Alkaline Phosphatase: 58 IU/L (ref 44–121)
Bilirubin Total: 0.7 mg/dL (ref 0.0–1.2)
Bilirubin, Direct: 0.22 mg/dL (ref 0.00–0.40)
Total Protein: 6.6 g/dL (ref 6.0–8.5)

## 2022-02-04 NOTE — Patient Instructions (Signed)
Medication Instructions:  NONE ordered at this time of appointment   *If you need a refill on your cardiac medications before your next appointment, please call your pharmacy*   Lab Work: Your physician recommends that you complete labs today and return for lab work in 1 week. LFTs (Today) Fasting Lipid (1 week)  If you have labs (blood work) drawn today and your tests are completely normal, you will receive your results only by: MyChart Message (if you have MyChart) OR A paper copy in the mail If you have any lab test that is abnormal or we need to change your treatment, we will call you to review the results.   Testing/Procedures: NONE ordered at this time of appointment     Follow-Up: At [facility], you and your health needs are our priority.  As part of our continuing mission to provide you with exceptional heart care, we have created designated Provider Care Teams.  These Care Teams include your primary Cardiologist (physician) and Advanced Practice Providers (APPs -  Physician Assistants and Nurse Practitioners) who all work together to provide you with the care you need, when you need it.  We recommend signing up for the patient portal called "MyChart".  Sign up information is provided on this After Visit Summary.  MyChart is used to connect with patients for Virtual Visits (Telemedicine).  Patients are able to view lab/test results, encounter notes, upcoming appointments, etc.  Non-urgent messages can be sent to your provider as well.   To learn more about what you can do with MyChart, go to [email or web address].    Your next appointment:   6 month(s)  The format for your next appointment:   In Person  Provider:   Olga Millers, MD     Other Instructions   Important Information About Sugar

## 2022-02-09 ENCOUNTER — Telehealth: Payer: Self-pay

## 2022-02-09 NOTE — Telephone Encounter (Signed)
Spoke with pt. Pt was notified of lab results. Pt will complete her fasting lab work this week and follow up as planned.

## 2022-02-10 DIAGNOSIS — I10 Essential (primary) hypertension: Secondary | ICD-10-CM | POA: Diagnosis not present

## 2022-02-10 DIAGNOSIS — E785 Hyperlipidemia, unspecified: Secondary | ICD-10-CM | POA: Diagnosis not present

## 2022-02-10 DIAGNOSIS — I25118 Atherosclerotic heart disease of native coronary artery with other forms of angina pectoris: Secondary | ICD-10-CM | POA: Diagnosis not present

## 2022-02-10 LAB — LIPID PANEL
Chol/HDL Ratio: 1.9 ratio (ref 0.0–4.4)
Cholesterol, Total: 165 mg/dL (ref 100–199)
HDL: 86 mg/dL (ref >39)
LDL Chol Calc (NIH): 61 mg/dL (ref 0–99)
Triglycerides: 103 mg/dL (ref 0–149)
VLDL Cholesterol Cal: 18 mg/dL (ref 5–40)

## 2022-02-15 ENCOUNTER — Telehealth: Payer: Self-pay

## 2022-02-15 NOTE — Telephone Encounter (Signed)
Spoke with pt. Pt was notified of lab results and pt will f/u as planned.

## 2022-02-16 ENCOUNTER — Telehealth: Payer: Self-pay | Admitting: Cardiology

## 2022-02-16 NOTE — Telephone Encounter (Signed)
Patient is asking that her labs and ct results be sent to her PCP. Please advise

## 2022-02-16 NOTE — Telephone Encounter (Signed)
Noted, results faxed to pcp

## 2022-02-22 ENCOUNTER — Encounter: Payer: Self-pay | Admitting: Physician Assistant

## 2022-03-02 ENCOUNTER — Other Ambulatory Visit: Payer: Self-pay | Admitting: Family Medicine

## 2022-03-02 DIAGNOSIS — Z1231 Encounter for screening mammogram for malignant neoplasm of breast: Secondary | ICD-10-CM

## 2022-03-11 ENCOUNTER — Telehealth: Payer: Self-pay | Admitting: Cardiology

## 2022-03-11 NOTE — Telephone Encounter (Signed)
Pt c/o of Chest Pain: STAT if CP now or developed within 24 hours  1. Are you having CP right now? No   2. Are you experiencing any other symptoms (ex. SOB, nausea, vomiting, sweating)? No   3. How long have you been experiencing CP? Ongoing at least a month   4. Is your CP continuous or coming and going? Coming and going   5. Have you taken Nitroglycerin? No  ?   Patient also states she just took a trip to [place] for two days. While on the 3 1/2 hour flight on 09/30 she had pitting edema in the top of her ankles that stopped as soon as she landed. She reports that is the first time she's ever had this occur. Patient states she has been getting CP everyday after she eats and is unsure if this is heart related. She has scheduled a GI appt regarding this and would also like to discuss the details of the heart cath previously discussed when called back as well. Please advise.

## 2022-03-11 NOTE — Telephone Encounter (Signed)
Crestor would not be causing CP; can arrange APPov if she wishes  Kirk Ruths     Will close this encounter for now until the pt reaches back out... she refused APP appt at this time.

## 2022-03-11 NOTE — Telephone Encounter (Signed)
Pt called to report that she had some chest discomfort when walking through the yard to pay the lawn man.. she had been shoveling mulch prior to a recent trip several weeks.Marland Kitchenas she used to be able to do and she had to take several  breaks due to giving out... she says she has walked quite a bit since she had the chest discomfort and has been okay since.. she is wondering if the Crestor was adding to the symptoms that she had... she refused an appt for now... she says she will monitor and let us know if anything changes.. her daughter is sick and could nit bring her right now... she had some pedal edema on the plane back from [place] recently but has since resolved.. I talked to her about avoiding salt, to be sure she hydrates well and if she develops any further chest pain to call us asap or go to the Ed if needed.         Marland Kitchen CAD/chest pain: Coronary CTA in June 2023 revealed coronary calcium score of 14.5 (13 percentile for age, sex, and race-matched controls), severe (66 to 99%) soft plaque stenosis in the mid LAD, FFR suggested mid LAD lesion was not flow-limiting.  She thinks her prior symptoms were related to GERD. Previously declined cath, declined antianginal therapy. Stable with no anginal symptoms. No indication for ischemic evaluation. Continue Crestor.

## 2022-03-19 NOTE — Progress Notes (Unsigned)
03/25/2022 Holly Conley [id number] 02-26-1936  Referring provider: Johny Blamer, MD Primary GI doctor: Dr. Lavon Paganini  ASSESSMENT AND PLAN:   Gastroesophageal reflux disease with intermittent dysphagia, possible noncardiac chest pain/esophageal spasm Patient describes intermittent dysphagia, has a lot of anxiety, poor fitting dentures. Recent CTA with severe stenosis in mid LAD but no flow-limiting lesion.  Being treated medically Long discussion with the patient about proceeding with EGD versus barium swallow, due to intermittent symptoms, no alarm symptoms, and patient's anxiety we will proceed with barium swallow first and trial of proton pump inhibitor twice daily. If barium swallow shows any strictures or stenosis, will plan for EGD with dilitation. If patient does not improve with medical treatment, will proceed with endoscopy. Lifestyle modifications discussed with patient, dysphagia recommendations discussed with patient. Patient agrees with this plan, close follow-up.  Coronary artery disease due to lipid rich plaque Recent CTA with severe stenosis in mid LAD but no flow-limiting lesion.  Being treated medically Continues to have intermittent chest pain, does not take nitroglycerin for this.  Nonexertional, sounds like it could be anxiety, esophageal spasm, esophagitis, GERD.  Anxiety Patient very anxious Has Valium as needed for anxiety, can discuss with primary care long-acting SSRI  Patient Care Team: Johny Blamer, MD as PCP - General (Family Medicine) Jens Som Madolyn Frieze, MD as PCP - Cardiology (Cardiology)  HISTORY OF PRESENT ILLNESS: 86 y.o. female with a past medical history of hypertension, hyperlipidemia, prediabetes, vertigo, thrombocytopenia, osteoporosis, anxiety, coronary artery disease follows with Dr. Jens Som  GERD and others listed below presents for evaluation of GERD.   04/2003 colonoscopy with Dr. Matthias Hughs for screening, 2 mm sessile  polyp  Patient recently saw cardiologist for nonexertional midsternal chest discomfort.  June 2023 CTA with coronary disease but no flow limiting lesions so decided medical management. She is on crestor and b ASA.   Thought potentially discomfort was GERD related. She states the chest pain comes and goes, has dull center chest pain can be left sided, last seconds. Can happen 3 x a week, has been since June.  Was surprised did not happen with being late and having to drive, can be worse with anxiety and can happen after food.  She states she "does not eat what I'm suppose to", she eats all through out the day and likes to chocolate.  She has food/pills get stuck but it comes and goes, states worse if she does not chew well and has dentures, she is on pepcid twice a day. She is not on PPI.  Very rarely gets caught with liquids.  She is on ibuprofen as needed, 2 x a month. Social ETOH rare.  States she has been eating less food but no weight loss.  She is on miralax and has BM daily with this, occ she has loose stool and will back off. She states she had recent trip and if she travels will get "spastic" and have issues with her bowel.   Coronary CTA in June 2023 revealed coronary calcium score of 14.5 (13 percentile for age, sex, and race-matched controls), severe (70 to 99%) soft plaque stenosis in the mid LAD, FFR suggested mid LAD lesion was not flow-limiting.   She  reports that she has never smoked. She has never used smokeless tobacco. She reports current alcohol use. She reports that she does not use drugs.  Current Medications:    Current Outpatient Medications (Cardiovascular):    nitroGLYCERIN (NITROSTAT) 0.4 MG SL tablet, Place 1 tablet (0.4  mg total) under the tongue every 5 (five) minutes as needed for chest pain.   rosuvastatin (CRESTOR) 20 MG tablet, Take 1 tablet (20 mg total) by mouth daily.   Current Outpatient Medications (Analgesics):    ibuprofen (ADVIL) 200 MG tablet, 1  tablet with food or milk as needed   Current Outpatient Medications (Other):    CALCIUM-MAGNESIUM-ZINC PO, Take 1 tablet by mouth daily.   cholecalciferol (VITAMIN D) 1000 UNITS tablet, Take 2,000 Units by mouth 2 (two) times daily.    cyclobenzaprine (FLEXERIL) 10 MG tablet, Take 10 mg by mouth 3 (three) times daily as needed for muscle spasms.   diazepam (VALIUM) 10 MG tablet, Take 10 mg by mouth every 6 (six) hours as needed for anxiety.   famotidine (PEPCID) 20 MG tablet, 1 tablet as needed   Garlic 0626 MG CAPS, See admin instructions.   latanoprost (XALATAN) 0.005 % ophthalmic solution, Place 1 drop into both eyes at bedtime.    Multiple Vitamin (MULTIVITAMIN) tablet, Take 1 tablet by mouth daily.   pantoprazole (PROTONIX) 40 MG tablet, Take 1 tablet (40 mg total) by mouth 2 (two) times daily before a meal.   Polyethyl Glycol-Propyl Glycol (SYSTANE) 0.4-0.3 % SOLN, Apply to eye as needed.   polyethylene glycol (MIRALAX / GLYCOLAX) packet, Take 17 g by mouth daily.    Probiotic Product (PROBIOTIC ADVANCED PO), Take 1 capsule by mouth daily.   Propylene Glycol (SYSTANE BALANCE OP), Apply to eye.   timolol (BETIMOL) 0.25 % ophthalmic solution, Place 1 drop into both eyes daily.   timolol (TIMOPTIC) 0.25 % ophthalmic solution, Administer 1 drop to both eyes daily.  Medical History:  Past Medical History:  Diagnosis Date   Anxiety    Anxiety    Arthritis    knees and hands   Colon polyps    Depression    Dizziness    DJD (degenerative joint disease)    GERD (gastroesophageal reflux disease)    Glaucoma    High blood pressure    Hyperlipidemia    Hypertension    Insomnia    Multiple allergies    Osteopenia    Osteoporosis    R and L hip   Panic attacks    Paresthesias    Prediabetes    PVC (premature ventricular contraction)    Restless leg syndrome    Thrombocytopenia (HCC)    Vertigo    BPPV   Vitamin D deficiency    Allergies:  Allergies  Allergen Reactions    Codeine     nauseated   Morphine And Related     nauseated   Penicillins      Surgical History:  She  has a past surgical history that includes Vaginal hysterectomy; Appendectomy; Tubal ligation; Tonsillectomy and adenoidectomy; Dilation and curettage of uterus; closed reduction (Right); Cataract extraction, bilateral; and Foot surgery (Left). Family History:  Her family history includes Bladder Cancer in her mother; Colon polyps in her father and sister; Coronary artery disease in her mother; Diabetes Mellitus I in her mother; Heart disease in her father; Heart failure in her father; Hyperlipidemia in her sister; Hypertension in her father, mother, and sister.  REVIEW OF SYSTEMS  : All other systems reviewed and negative except where noted in the History of Present Illness.  PHYSICAL EXAM: BP (!) 140/88   Pulse (!) 59   Ht 5\' 1"  (1.549 m)   Wt 127 lb (57.6 kg)   SpO2 98%   BMI 24.00 kg/m  General:  Pleasant, well developed female in no acute distress Head:   Normocephalic and atraumatic. Eyes:  sclerae anicteric,conjunctive pink  Heart:   regular rate and rhythm, mild chest wall tenderness to palpation Pulm:  Clear anteriorly; no wheezing Abdomen:   Soft, Obese AB, Active bowel sounds. No tenderness . , No organomegaly appreciated. Rectal: Not evaluated Extremities:  Without edema. Msk: Symmetrical without gross deformities. Peripheral pulses intact.  Neurologic:  Alert and  oriented x4;  No focal deficits.  Skin:   Dry and intact without significant lesions or rashes. Psychiatric:  Cooperative. Anxious mood, normal affect    Doree Albee, PA-C 11:27 AM

## 2022-03-25 ENCOUNTER — Ambulatory Visit: Payer: Medicare Other | Admitting: Physician Assistant

## 2022-03-25 ENCOUNTER — Encounter: Payer: Self-pay | Admitting: Physician Assistant

## 2022-03-25 VITALS — BP 140/88 | HR 59 | Ht 61.0 in | Wt 127.0 lb

## 2022-03-25 DIAGNOSIS — R1319 Other dysphagia: Secondary | ICD-10-CM | POA: Diagnosis not present

## 2022-03-25 DIAGNOSIS — I2510 Atherosclerotic heart disease of native coronary artery without angina pectoris: Secondary | ICD-10-CM

## 2022-03-25 DIAGNOSIS — K2100 Gastro-esophageal reflux disease with esophagitis, without bleeding: Secondary | ICD-10-CM

## 2022-03-25 DIAGNOSIS — I2583 Coronary atherosclerosis due to lipid rich plaque: Secondary | ICD-10-CM

## 2022-03-25 DIAGNOSIS — F419 Anxiety disorder, unspecified: Secondary | ICD-10-CM | POA: Insufficient documentation

## 2022-03-25 MED ORDER — PANTOPRAZOLE SODIUM 40 MG PO TBEC: 40.0000 mg | DELAYED_RELEASE_TABLET | Freq: Two times a day (BID) | ORAL | 0 refills | Status: DC

## 2022-03-25 NOTE — Patient Instructions (Addendum)
Please take your proton pump inhibitor medication, pantoprazole twice a day, stop the pepcid  Please take this medication 30 minutes to 1 hour before meals- this makes it more effective.  Avoid spicy and acidic foods Avoid fatty foods Limit your intake of coffee, tea, alcohol, and carbonated drinks Work to maintain a healthy weight Keep the head of the bed elevated at least 3 inches with blocks or a wedge pillow if you are having any nighttime symptoms Stay upright for 2 hours after eating Avoid meals and snacks three to four hours before bedtime   Miralax is an osmotic laxative.  It only brings more water into the stool.  This is safe to take daily.  Can take up to 17 gram of miralax twice a day.  Mix with juice or coffee.  Start 1 capful at night for 3-4 days and reassess your response in 3-4 days.  You can increase and decrease the dose based on your response.  Remember, it can take up to 3-4 days to take effect OR for the effects to wear off.   I often pair this with benefiber in the morning to help assure the stool is not too loose.   You will be contacted by [facility] in the next 2 days to arrange a Barium Swallow..  The number on your caller ID will be [phone], please answer when they call.  If you have not heard from them in 2 days please call [phone] to schedule.    You have been scheduled for a Barium Esophogram at ______________________________ on ____________________ at _________________. Please arrive 15 minutes prior to your appointment for registration. Make certain not to have anything to eat or drink 3 hours prior to your test. If you need to reschedule for any reason, please contact radiology at [phone] to do so. __________________________________________________________________ A barium swallow is an examination that concentrates on views of the esophagus. This tends to be a double contrast exam (barium and two liquids which, when  combined, create a gas to distend the wall of the oesophagus) or single contrast (non-ionic iodine based). The study is usually tailored to your symptoms so a good history is essential. Attention is paid during the study to the form, structure and configuration of the esophagus, looking for functional disorders (such as aspiration, dysphagia, achalasia, motility and reflux) EXAMINATION You may be asked to change into a gown, depending on the type of swallow being performed. A radiologist and radiographer will perform the procedure. The radiologist will advise you of the type of contrast selected for your procedure and direct you during the exam. You will be asked to stand, sit or lie in several different positions and to hold a small amount of fluid in your mouth before being asked to swallow while the imaging is performed .In some instances you may be asked to swallow barium coated marshmallows to assess the motility of a solid food bolus. The exam can be recorded as a digital or video fluoroscopy procedure. POST PROCEDURE It will take 1-2 days for the barium to pass through your system. To facilitate this, it is important, unless otherwise directed, to increase your fluids for the next 24-48hrs and to resume your normal diet.  This test typically takes about 30 minutes to perform. __________________________________________________________________________________  I appreciate the opportunity to care for you. Vicie Mutters, PA-C

## 2022-03-26 ENCOUNTER — Telehealth: Payer: Self-pay | Admitting: Physician Assistant

## 2022-03-26 NOTE — Telephone Encounter (Signed)
Inbound call from patient stating she wants to proceed with EGD and forgo doing the barium swallow, would like to know if this is a possibility. Please advise.

## 2022-03-29 NOTE — Telephone Encounter (Signed)
Patient called in to discuss scheduling EGD. Schedulers reached out to schedule barium swallow, however patient refused. States she would rather proceed with EGD only. Patient has been advised that at time of OV it was recommended she start with the barium swallow first (less invasive) & then EGD would be decided on based on results. Pt said at time of OV it was made out to her that she may end up needing both anyway's, and would rather just have EGD unless Estill Bamberg highly suggest starting with barium swallow.

## 2022-03-29 NOTE — Telephone Encounter (Signed)
Spoke with patient regarding PA recommendations. Pt verbalized all understanding. Scheduling number provided for patient.

## 2022-04-01 DIAGNOSIS — L821 Other seborrheic keratosis: Secondary | ICD-10-CM | POA: Diagnosis not present

## 2022-04-01 DIAGNOSIS — D2230 Melanocytic nevi of unspecified part of face: Secondary | ICD-10-CM | POA: Diagnosis not present

## 2022-04-01 DIAGNOSIS — L578 Other skin changes due to chronic exposure to nonionizing radiation: Secondary | ICD-10-CM | POA: Diagnosis not present

## 2022-04-01 DIAGNOSIS — L814 Other melanin hyperpigmentation: Secondary | ICD-10-CM | POA: Diagnosis not present

## 2022-04-12 ENCOUNTER — Ambulatory Visit: Payer: Medicare Other

## 2022-04-13 ENCOUNTER — Ambulatory Visit (HOSPITAL_COMMUNITY)
Admission: RE | Admit: 2022-04-13 | Discharge: 2022-04-13 | Disposition: A | Discharge status: Home / Self Care | Payer: Medicare Other | Source: Ambulatory Visit | Attending: Physician Assistant | Admitting: Physician Assistant

## 2022-04-13 DIAGNOSIS — K219 Gastro-esophageal reflux disease without esophagitis: Secondary | ICD-10-CM | POA: Diagnosis not present

## 2022-04-13 DIAGNOSIS — R1310 Dysphagia, unspecified: Secondary | ICD-10-CM | POA: Diagnosis not present

## 2022-04-13 DIAGNOSIS — K2100 Gastro-esophageal reflux disease with esophagitis, without bleeding: Secondary | ICD-10-CM | POA: Insufficient documentation

## 2022-04-13 DIAGNOSIS — R1319 Other dysphagia: Secondary | ICD-10-CM | POA: Diagnosis not present

## 2022-04-13 DIAGNOSIS — K449 Diaphragmatic hernia without obstruction or gangrene: Secondary | ICD-10-CM | POA: Diagnosis not present

## 2022-04-14 DIAGNOSIS — K219 Gastro-esophageal reflux disease without esophagitis: Secondary | ICD-10-CM | POA: Diagnosis not present

## 2022-04-14 DIAGNOSIS — E7800 Pure hypercholesterolemia, unspecified: Secondary | ICD-10-CM | POA: Diagnosis not present

## 2022-04-14 DIAGNOSIS — R7303 Prediabetes: Secondary | ICD-10-CM | POA: Diagnosis not present

## 2022-04-14 DIAGNOSIS — E559 Vitamin D deficiency, unspecified: Secondary | ICD-10-CM | POA: Diagnosis not present

## 2022-04-14 DIAGNOSIS — M810 Age-related osteoporosis without current pathological fracture: Secondary | ICD-10-CM | POA: Diagnosis not present

## 2022-04-14 DIAGNOSIS — Z0000 Encounter for general adult medical examination without abnormal findings: Secondary | ICD-10-CM | POA: Diagnosis not present

## 2022-04-14 DIAGNOSIS — E785 Hyperlipidemia, unspecified: Secondary | ICD-10-CM | POA: Diagnosis not present

## 2022-04-15 NOTE — Telephone Encounter (Signed)
Patient states she is doing very well on her pantoprazole & would prefer to cancel her upcoming appointment, and schedule with Dr. Lavon Paganini in the spring. Advised that she call to make an appointment in the earlier part of February given how fast appointments are booked, and if she has any concerns before then, then to call back and reschedule sooner.

## 2022-04-15 NOTE — Telephone Encounter (Signed)
Patient called wondering if the 2 month follow up was necessary, states that the new medication is working. Please advise

## 2022-05-03 ENCOUNTER — Ambulatory Visit: Payer: Medicare Other

## 2022-05-04 ENCOUNTER — Ambulatory Visit: Payer: Medicare Other | Admitting: Physician Assistant

## 2022-05-04 ENCOUNTER — Ambulatory Visit
Admission: RE | Admit: 2022-05-04 | Discharge: 2022-05-04 | Disposition: A | Discharge status: Home / Self Care | Payer: Medicare Other | Source: Ambulatory Visit | Attending: Family Medicine | Admitting: Family Medicine

## 2022-05-04 DIAGNOSIS — Z1231 Encounter for screening mammogram for malignant neoplasm of breast: Secondary | ICD-10-CM

## 2022-05-06 ENCOUNTER — Other Ambulatory Visit: Payer: Self-pay | Admitting: Family Medicine

## 2022-05-06 DIAGNOSIS — Z1231 Encounter for screening mammogram for malignant neoplasm of breast: Secondary | ICD-10-CM

## 2022-06-21 ENCOUNTER — Other Ambulatory Visit: Payer: Self-pay | Admitting: Physician Assistant

## 2022-06-21 DIAGNOSIS — K2100 Gastro-esophageal reflux disease with esophagitis, without bleeding: Secondary | ICD-10-CM

## 2022-06-21 DIAGNOSIS — R1319 Other dysphagia: Secondary | ICD-10-CM

## 2022-06-23 DIAGNOSIS — K080 Exfoliation of teeth due to systemic causes: Secondary | ICD-10-CM | POA: Diagnosis not present

## 2022-08-16 DIAGNOSIS — M25572 Pain in left ankle and joints of left foot: Secondary | ICD-10-CM | POA: Diagnosis not present

## 2022-08-16 DIAGNOSIS — M25512 Pain in left shoulder: Secondary | ICD-10-CM | POA: Diagnosis not present

## 2022-08-20 ENCOUNTER — Telehealth: Payer: Self-pay | Admitting: Cardiology

## 2022-08-20 NOTE — Telephone Encounter (Signed)
Pt c/o of Chest Pain: STAT if CP now or developed within 24 hours  1. Are you having CP right now? no  2. Are you experiencing any other symptoms (ex. SOB, nausea, vomiting, sweating)? no  3. How long have you been experiencing CP? at least a month, several weeks  4. Is your CP continuous or coming and going? Comes and goes  5. Have you taken Nitroglycerin? no  Patient states she has been having chest and shoulder pain for at least a month. She says she has also been very tired. She says she also had guests over and had to clean up after them. She says she has bone spurs in her neck and a fusion. She also says the cartilage is gone in her hips and knees and her shoulder is bone on bone. She says because of this she is not sure if her pain is heart related or not. She says she was put on a statin and a baby aspirin to break up the plaque in her arteries. She says last night she fell asleep at 7 pm and did not wake up until 11:46 pm. She says she took an ibuprofen and then went back to sleep. She says she did not wake up again until this morning. She says she has an appointment at 11 am and hopes to be back home after 1 pm.

## 2022-08-23 NOTE — Telephone Encounter (Signed)
    Called the patient to follow up on how she is feeling today. No answer, left message and call back number.

## 2022-08-23 NOTE — Telephone Encounter (Signed)
Pt c/o of Chest Pain: STAT if CP now or developed within 24 hours   1. Are you having CP right now? no   2. Are you experiencing any other symptoms (ex. SOB, nausea, vomiting, sweating)? no   3. How long have you been experiencing CP? at least a month, several weeks   4. Is your CP continuous or coming and going? Comes and goes   5. Have you taken Nitroglycerin? no   Patient states she has been having chest and shoulder pain for at least a month. She says she has also been very tired. She says she also had guests over and had to clean up after them. She says she has bone spurs in her neck and a fusion. She also says the cartilage is gone in her hips and knees and her shoulder is bone on bone. She says because of this she is not sure if her pain is heart related or not. She says she was put on a statin and a baby aspirin to break up the plaque in her arteries. She says last night she fell asleep at 7 pm and did not wake up until 11:46 pm. She says she took an ibuprofen and then went back to sleep. She says she did not wake up again until this morning. She says she has an appointment at 11 am and hopes to be back home after 1 pm.     Pt was seen by ortho last Monday- they said everything was "status- quo."  She said that she isn't sure if maybe she "worked herself up into a panick attack." She said that she has never had to take a NTG.  She reports that she is not short of breath, but she does get tired quicker than she used to when she is doing yard work. She does not have pain in chest right now or with walking around, no sweating, no dizziness. She feels fine right now/today; it was just last week. She wants to be seen by Doctor within a few weeks if she can.  Made an apptointment. Went over NTG and ER precautions.

## 2022-08-25 NOTE — Progress Notes (Signed)
HPI: FU CP.   ABIs January 2019 normal; bilateral toe brachial indices abnormal.  Coronary CTA June 2023 showed calcified plaque in the proximal LAD and severe soft plaque stenosis in the mid vessel; calcium score 14.5 which was 13th percentile.  FFR suggested mid LAD lesion not flow-limiting.  Patient was seen in follow-up by Diona Browner and declined catheterization at that time.  She has been treated medically.  Since last seen patient denies dyspnea, orthopnea, PND or pedal edema.  She has not had exertional chest pain.  4 days ago she ate at a restaurant and had some beer.  She later woke with nausea and vomiting.  She also had some shoulder pain.  She also has had difficulties with intermittent dizziness that is not related to position.  Current Outpatient Medications  Medication Sig Dispense Refill   CALCIUM-MAGNESIUM-ZINC PO Take 1 tablet by mouth daily.     cholecalciferol (VITAMIN D) 1000 UNITS tablet Take 2,000 Units by mouth 2 (two) times daily.      cyclobenzaprine (FLEXERIL) 10 MG tablet Take 10 mg by mouth 3 (three) times daily as needed for muscle spasms.     diazepam (VALIUM) 10 MG tablet Take 10 mg by mouth every 6 (six) hours as needed for anxiety.     famotidine (PEPCID) 20 MG tablet 1 tablet as needed     Garlic [id number] MG CAPS See admin instructions.     ibuprofen (ADVIL) 200 MG tablet 1 tablet with food or milk as needed     latanoprost (XALATAN) 0.005 % ophthalmic solution Place 1 drop into both eyes at bedtime.   1   Multiple Vitamin (MULTIVITAMIN) tablet Take 1 tablet by mouth daily.     pantoprazole (PROTONIX) 40 MG tablet TAKE 1 TABLET BY MOUTH TWICE DAILY BEFORE A MEAL 180 tablet 0   Polyethyl Glycol-Propyl Glycol (SYSTANE) 0.4-0.3 % SOLN Apply to eye as needed.     polyethylene glycol (MIRALAX / GLYCOLAX) packet Take 17 g by mouth daily.      Probiotic Product (PROBIOTIC ADVANCED PO) Take 1 capsule by mouth daily.     Propylene Glycol (SYSTANE BALANCE OP) Apply to  eye.     rosuvastatin (CRESTOR) 20 MG tablet Take 1 tablet (20 mg total) by mouth daily. 90 tablet 3   timolol (BETIMOL) 0.25 % ophthalmic solution Place 1 drop into both eyes daily.     timolol (TIMOPTIC) 0.25 % ophthalmic solution Administer 1 drop to both eyes daily.     nitroGLYCERIN (NITROSTAT) 0.4 MG SL tablet Place 1 tablet (0.4 mg total) under the tongue every 5 (five) minutes as needed for chest pain. 25 tablet 11   No current facility-administered medications for this visit.     Past Medical History:  Diagnosis Date   Anxiety    Anxiety    Arthritis    knees and hands   Colon polyps    Depression    Dizziness    DJD (degenerative joint disease)    GERD (gastroesophageal reflux disease)    Glaucoma    High blood pressure    Hyperlipidemia    Hypertension    Insomnia    Multiple allergies    Osteopenia    Osteoporosis    R and L hip   Panic attacks    Paresthesias    Prediabetes    PVC (premature ventricular contraction)    Restless leg syndrome    Thrombocytopenia (HCC)    Vertigo  BPPV   Vitamin D deficiency     Past Surgical History:  Procedure Laterality Date   APPENDECTOMY     CATARACT EXTRACTION, BILATERAL     closed reduction Right    foot   DILATION AND CURETTAGE OF UTERUS     FOOT SURGERY Left    titanium L ankle   TONSILLECTOMY AND ADENOIDECTOMY     TUBAL LIGATION     VAGINAL HYSTERECTOMY      Social History   Socioeconomic History   Marital status: Widowed    Spouse name: Not on file   Number of children: 3   Years of education: Not on file   Highest education level: Not on file  Occupational History   Occupation: retired Therapist, sports  Tobacco Use   Smoking status: Never   Smokeless tobacco: Never  Substance and Sexual Activity   Alcohol use: Yes    Comment: socially   Drug use: No   Sexual activity: Not on file  Other Topics Concern   Not on file  Social History Narrative   Lives home alone.  Widowed.  Education: Emergency planning/management officer.  Children  3.     Social Determinants of Radio broadcast assistant Strain: Not on file  Food Insecurity: Not on file  Transportation Needs: Not on file  Physical Activity: Not on file  Stress: Not on file  Social Connections: Not on file  Intimate Partner Violence: Not on file    Family History  Problem Relation Age of Onset   Bladder Cancer Mother    Diabetes Mellitus I Mother    Coronary artery disease Mother    Hypertension Mother    Heart failure Father    Heart disease Father    Hypertension Father    Colon polyps Father    Hyperlipidemia Sister    Hypertension Sister    Colon polyps Sister    Esophageal cancer Neg Hx    Liver disease Neg Hx    Breast cancer Neg Hx     ROS: no fevers or chills, productive cough, hemoptysis, dysphasia, odynophagia, melena, hematochezia, dysuria, hematuria, rash, seizure activity, orthopnea, PND, pedal edema, claudication. Remaining systems are negative.  Physical Exam: Well-developed well-nourished in no acute distress.  Skin is warm and dry.  HEENT is normal.  Neck is supple.  Chest is clear to auscultation with normal expansion.  Cardiovascular exam is regular rate and rhythm.  Abdominal exam nontender or distended. No masses palpated. Extremities show no edema. neuro grossly intact  ECG-sinus bradycardia at a rate of 56, normal axis, nonspecific ST-T wave changes.  No change compared to November 13, 2021.  Personally reviewed  A/P  1 coronary artery disease-patient denies exertional chest pain.  I do not think her recent symptoms are cardiac related.  Plan to continue medical therapy.  As outlined above her FFR was negative.  Continue aspirin and statin.  2 hyperlipidemia-continue statin.   3 hypertension-patient's blood pressure is controlled.  She is having occasional dizziness that is persistent and unrelated to position.  I do not think it is cardiac related.  Kirk Ruths, MD

## 2022-08-31 ENCOUNTER — Encounter: Payer: Self-pay | Admitting: Cardiology

## 2022-08-31 ENCOUNTER — Ambulatory Visit: Payer: Medicare Other | Attending: Cardiology | Admitting: Cardiology

## 2022-08-31 VITALS — BP 134/88 | HR 56 | Ht 61.0 in | Wt 131.0 lb

## 2022-08-31 DIAGNOSIS — E785 Hyperlipidemia, unspecified: Secondary | ICD-10-CM

## 2022-08-31 DIAGNOSIS — I25118 Atherosclerotic heart disease of native coronary artery with other forms of angina pectoris: Secondary | ICD-10-CM | POA: Diagnosis not present

## 2022-08-31 DIAGNOSIS — I10 Essential (primary) hypertension: Secondary | ICD-10-CM

## 2022-08-31 DIAGNOSIS — R42 Dizziness and giddiness: Secondary | ICD-10-CM | POA: Diagnosis not present

## 2022-08-31 NOTE — Patient Instructions (Signed)
Medication Instructions:  Continue same medications *If you need a refill on your cardiac medications before your next appointment, please call your pharmacy*   Lab Work: None ordered   Testing/Procedures: None ordered   Follow-Up: At [facility], you and your health needs are our priority.  As part of our continuing mission to provide you with exceptional heart care, we have created designated Provider Care Teams.  These Care Teams include your primary Cardiologist (physician) and Advanced Practice Providers (APPs -  Physician Assistants and Nurse Practitioners) who all work together to provide you with the care you need, when you need it.  We recommend signing up for the patient portal called "MyChart".  Sign up information is provided on this After Visit Summary.  MyChart is used to connect with patients for Virtual Visits (Telemedicine).  Patients are able to view lab/test results, encounter notes, upcoming appointments, etc.  Non-urgent messages can be sent to your provider as well.   To learn more about what you can do with MyChart, go to [email or web address].    Your next appointment:  6 months    Provider:  Lubertha South

## 2022-09-13 NOTE — Progress Notes (Signed)
Holly Conley    [id number]    10-29-1935  Primary Care Physician:Harris, Chrissie Noa, MD  Referring Physician: Johny Blamer, MD [id number] [place],  [place] [address]   Chief complaint:  Chief Complaint  Patient presents with   Gastroesophageal Reflux    Pt ? If her stomach problems could cause dizziness, pt gets lightheaded and dizziness after eating intermittently     HPI:   Holly Conley is a 87 y.o. very pleasant female here for follow-up visit for GERD and intermittent dysphagia Patient has been seen by PA Quentin Mulling on 03-25-22. Per that note, she was experiencing intermittent dysphagia and GERD symptoms. She was prescribed Pantoprazole 40 mg.  Today, she reports feeling stressed today. She states she's had an appointment with her optometrist earlier this morning.  She complains of dysphagia of solids and pills occasionally. She reports having vertigo and complains of occasionally experiencing dizziness after eating. She reports recently getting dentures and reports having problems with chewing since getting them. She also states that she typically eats food very slowly.  She has occasional difficulty with chips or rice, feels like it gets hung up back of her throat and she cannot clear it.  She reports taking valium 10 mg typically once a week for muscle spasms or when she need to sleep. She states she takes pantoprazole 2x daily for reflux symptoms.   She denies diarrhea, constipation, nausea, blood in stool, black stool, vomiting, abdominal pain, bloating, unintentional weight loss, or reflux.  GI Hx:   DG Esophagus w double CM 04-13-22 1. No mucosal irregularity stricture or mass in the esophagus. 2. Moderate size (2 cm) esophageal diverticulum just above the GE junction. 3. Small hiatal hernia. 4. Mild gastroesophageal reflux.  Coronary CTA in June 2023 revealed coronary calcium score of 14.5 (13 percentile for age, sex, and  race-matched controls), severe (70 to 99%) soft plaque stenosis in the mid LAD, FFR suggested mid LAD lesion was not flow-limiting.   04/2003 colonoscopy with Dr. Matthias Hughs for screening, 2 mm sessile polyp    Current Outpatient Medications:    aspirin EC 81 MG tablet, Take 81 mg by mouth daily. Swallow whole., Disp: , Rfl:    CALCIUM-MAGNESIUM-ZINC PO, Take 1 tablet by mouth daily., Disp: , Rfl:    cholecalciferol (VITAMIN D) 1000 UNITS tablet, Take 2,000 Units by mouth 2 (two) times daily. , Disp: , Rfl:    cyclobenzaprine (FLEXERIL) 10 MG tablet, Take 10 mg by mouth 3 (three) times daily as needed for muscle spasms., Disp: , Rfl:    diazepam (VALIUM) 10 MG tablet, Take 10 mg by mouth every 6 (six) hours as needed for anxiety., Disp: , Rfl:    famotidine (PEPCID) 20 MG tablet, Take 20 mg by mouth 2 (two) times daily., Disp: , Rfl:    Garlic 1000 MG CAPS, See admin instructions., Disp: , Rfl:    ibuprofen (ADVIL) 200 MG tablet, 1 tablet with food or milk as needed, Disp: , Rfl:    latanoprost (XALATAN) 0.005 % ophthalmic solution, Place 1 drop into both eyes at bedtime. , Disp: , Rfl: 1   Multiple Vitamin (MULTIVITAMIN) tablet, Take 1 tablet by mouth daily., Disp: , Rfl:    pantoprazole (PROTONIX) 40 MG tablet, TAKE 1 TABLET BY MOUTH TWICE DAILY BEFORE A MEAL, Disp: 180 tablet, Rfl: 0   Polyethyl Glycol-Propyl Glycol (SYSTANE) 0.4-0.3 % SOLN, Apply to eye as needed.,  Disp: , Rfl:    polyethylene glycol (MIRALAX / GLYCOLAX) packet, Take 17 g by mouth daily. , Disp: , Rfl:    Probiotic Product (PROBIOTIC ADVANCED PO), Take 1 capsule by mouth daily., Disp: , Rfl:    Propylene Glycol (SYSTANE BALANCE OP), Apply to eye., Disp: , Rfl:    rosuvastatin (CRESTOR) 20 MG tablet, Take 1 tablet (20 mg total) by mouth daily., Disp: 90 tablet, Rfl: 3   timolol (TIMOPTIC) 0.25 % ophthalmic solution, Administer 1 drop to both eyes daily., Disp: , Rfl:    nitroGLYCERIN (NITROSTAT) 0.4 MG SL tablet, Place 1  tablet (0.4 mg total) under the tongue every 5 (five) minutes as needed for chest pain. (Patient not taking: Reported on 09/21/2022), Disp: 25 tablet, Rfl: 11   Allergies as of 09/21/2022 - Review Complete 09/21/2022  Allergen Reaction Noted   Codeine  03/29/2014   Morphine and related  03/29/2014   Penicillins  12/14/2016    Past Medical History:  Diagnosis Date   Anxiety    Anxiety    Arthritis    knees and hands   Colon polyps    Depression    Dizziness    DJD (degenerative joint disease)    GERD (gastroesophageal reflux disease)    Glaucoma    High blood pressure    Hyperlipidemia    Hypertension    Insomnia    Multiple allergies    Osteopenia    Osteoporosis    R and L hip   Panic attacks    Paresthesias    Prediabetes    PVC (premature ventricular contraction)    Restless leg syndrome    Thrombocytopenia    Vertigo    BPPV   Vitamin D deficiency     Past Surgical History:  Procedure Laterality Date   APPENDECTOMY     CATARACT EXTRACTION, BILATERAL     closed reduction Right    foot   DILATION AND CURETTAGE OF UTERUS     FOOT SURGERY Left    titanium L ankle   TONSILLECTOMY AND ADENOIDECTOMY     TUBAL LIGATION     VAGINAL HYSTERECTOMY      Family History  Problem Relation Age of Onset   Bladder Cancer Mother    Diabetes Mellitus I Mother    Coronary artery disease Mother    Hypertension Mother    Heart failure Father    Heart disease Father    Hypertension Father    Colon polyps Father    Hyperlipidemia Sister    Hypertension Sister    Colon polyps Sister    Esophageal cancer Neg Hx    Liver disease Neg Hx    Breast cancer Neg Hx     Social History   Socioeconomic History   Marital status: Widowed    Spouse name: Not on file   Number of children: 3   Years of education: Not on file   Highest education level: Not on file  Occupational History   Occupation: retired [employer]  Tobacco Use   Smoking status: Never   Smokeless tobacco: Never   Substance and Sexual Activity   Alcohol use: Yes    Comment: socially   Drug use: No   Sexual activity: Not on file  Other Topics Concern   Not on file  Social History Narrative   Lives home alone.  Widowed.  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  Children 3.     Social Determinants of Health   Financial Resource Strain: Not  on file  Food Insecurity: Not on file  Transportation Needs: Not on file  Physical Activity: Not on file  Stress: Not on file  Social Connections: Not on file  Intimate Partner Violence: Not on file      Review of systems: Review of Systems  Constitutional:  Negative for unexpected weight change.  HENT:  Positive for trouble swallowing.   Gastrointestinal:  Negative for abdominal distention, abdominal pain, blood in stool, constipation, diarrhea and vomiting.      Physical Exam: Vitals:   09/21/22 1024  BP: 130/72  Pulse: (!) 56   Body mass index is 24.47 kg/m. General: well-appearing   Eyes: sclera anicteric, no redness. Skin; warm and dry, no rash or jaundice noted Neuro: awake, alert and oriented x 3. Normal gross motor function and fluent speech   Data Reviewed:  Reviewed labs, radiology imaging, old records and pertinent past GI work up   Assessment and Plan/Recommendations:  87 year old very pleasant female with intermittent dysphagia and GERD.  Her symptoms are suggestive of possible oropharyngeal dysphagia, will obtain modified barium swallow to further evaluate  Reviewed barium esophagram findings, has small hiatal hernia, distal esophageal diverticula and spontaneous gastroesophageal reflux with mild dysmotility Continue antireflux measures Use pantoprazole daily in the evening, 30 minutes before supper and use 30 minutes before breakfast as needed Antireflux measures Small frequent meals Advised patient to limit or decrease use of Valium, may be contributing to dizziness and vertigo  This visit required 40 minutes of patient care (this  includes precharting, chart review, review of results, face-to-face time used for counseling as well as treatment plan and follow-up. The patient was provided an opportunity to ask questions and all were answered. The patient agreed with the plan and demonstrated an understanding of the instructions.  Iona BeardK. Veena Francenia Chimenti , MD  CC: Johny BlamerHarris, William, MD   Ladona MowI,Safa Hewitt ShortsM Kadhim,acting as a scribe for Marsa ArisKavitha Ramona Ruark, MD.,have documented all relevant documentation on the behalf of Marsa ArisKavitha Marcio Hoque, MD,as directed by  Marsa ArisKavitha Latisha Lasch, MD while in the presence of Marsa ArisKavitha Shareef Eddinger, MD.   I, Marsa ArisKavitha Lyrik Buresh, MD, have reviewed all documentation for this visit. The documentation on 09/21/22 for the exam, diagnosis, procedures, and orders are all accurate and complete.

## 2022-09-14 ENCOUNTER — Other Ambulatory Visit: Payer: Self-pay | Admitting: Physician Assistant

## 2022-09-14 DIAGNOSIS — K2100 Gastro-esophageal reflux disease with esophagitis, without bleeding: Secondary | ICD-10-CM

## 2022-09-14 DIAGNOSIS — R1319 Other dysphagia: Secondary | ICD-10-CM

## 2022-09-21 ENCOUNTER — Ambulatory Visit: Payer: Medicare Other | Admitting: Gastroenterology

## 2022-09-21 ENCOUNTER — Telehealth (HOSPITAL_COMMUNITY): Payer: Self-pay

## 2022-09-21 ENCOUNTER — Encounter: Payer: Self-pay | Admitting: Gastroenterology

## 2022-09-21 VITALS — BP 130/72 | HR 56 | Ht 61.0 in | Wt 129.5 lb

## 2022-09-21 DIAGNOSIS — H43813 Vitreous degeneration, bilateral: Secondary | ICD-10-CM | POA: Diagnosis not present

## 2022-09-21 DIAGNOSIS — H401212 Low-tension glaucoma, right eye, moderate stage: Secondary | ICD-10-CM | POA: Diagnosis not present

## 2022-09-21 DIAGNOSIS — R1312 Dysphagia, oropharyngeal phase: Secondary | ICD-10-CM

## 2022-09-21 DIAGNOSIS — H04123 Dry eye syndrome of bilateral lacrimal glands: Secondary | ICD-10-CM | POA: Diagnosis not present

## 2022-09-21 DIAGNOSIS — H26493 Other secondary cataract, bilateral: Secondary | ICD-10-CM | POA: Diagnosis not present

## 2022-09-21 NOTE — Telephone Encounter (Signed)
Called and spoke with patient to schedule OP Modified Barium Swallow - patient stated she is waiting on daughter to return from [place] and will contact us to schedule.

## 2022-09-21 NOTE — Patient Instructions (Addendum)
Use Pantoprazole daily  You will be contacted by [facility] Sched[facility] in the next 2 days to arrange a Modified barium swallow .  The number on your caller ID will be [phone], please answer when they call.  If you have not heard from them in 2 days please call [phone] to schedule.      Gastroesophageal Reflux Disease, Adult Gastroesophageal reflux (GER) happens when acid from the stomach flows up into the tube that connects the mouth and the stomach (esophagus). Normally, food travels down the esophagus and stays in the stomach to be digested. However, when a person has GER, food and stomach acid sometimes move back up into the esophagus. If this becomes a more serious problem, the person may be diagnosed with a disease called gastroesophageal reflux disease (GERD). GERD occurs when the reflux: Happens often. Causes frequent or severe symptoms. Causes problems such as damage to the esophagus. When stomach acid comes in contact with the esophagus, the acid may cause inflammation in the esophagus. Over time, GERD may create small holes (ulcers) in the lining of the esophagus. What are the causes? This condition is caused by a problem with the muscle between the esophagus and the stomach (lower esophageal sphincter, or LES). Normally, the LES muscle closes after food passes through the esophagus to the stomach. When the LES is weakened or abnormal, it does not close properly, and that allows food and stomach acid to go back up into the esophagus. The LES can be weakened by certain dietary substances, medicines, and medical conditions, including: Tobacco use. Pregnancy. Having a hiatal hernia. Alcohol use. Certain foods and beverages, such as coffee, chocolate, onions, and peppermint. What increases the risk? You are more likely to develop this condition if you: Have an increased body weight. Have a connective tissue disorder. Take NSAIDs, such as ibuprofen. What are the signs  or symptoms? Symptoms of this condition include: Heartburn. Difficult or painful swallowing and the feeling of having a lump in the throat. A bitter taste in the mouth. Bad breath and having a large amount of saliva. Having an upset or bloated stomach and belching. Chest pain. Different conditions can cause chest pain. Make sure you see your health care provider if you experience chest pain. Shortness of breath or wheezing. Ongoing (chronic) cough or a nighttime cough. Wearing away of tooth enamel. Weight loss. How is this diagnosed? This condition may be diagnosed based on a medical history and a physical exam. To determine if you have mild or severe GERD, your health care provider may also monitor how you respond to treatment. You may also have tests, including: A test to examine your stomach and esophagus with a small camera (endoscopy). A test that measures the acidity level in your esophagus. A test that measures how much pressure is on your esophagus. A barium swallow or modified barium swallow test to show the shape, size, and functioning of your esophagus. How is this treated? Treatment for this condition may vary depending on how severe your symptoms are. Your health care provider may recommend: Changes to your diet. Medicine. Surgery. The goal of treatment is to help relieve your symptoms and to prevent complications. Follow these instructions at home: Eating and drinking  Follow a diet as recommended by your health care provider. This may involve avoiding foods and drinks such as: Coffee and tea, with or without caffeine. Drinks that contain alcohol. Energy drinks and sports drinks. Carbonated drinks or sodas. Chocolate and cocoa. Peppermint and  mint flavorings. Garlic and onions. Horseradish. Spicy and acidic foods, including peppers, chili powder, curry powder, vinegar, hot sauces, and barbecue sauce. Citrus fruit juices and citrus fruits, such as oranges, lemons,  and limes. Tomato-based foods, such as red sauce, chili, salsa, and pizza with red sauce. Fried and fatty foods, such as donuts, french fries, potato chips, and high-fat dressings. High-fat meats, such as hot dogs and fatty cuts of red and white meats, such as rib eye steak, sausage, ham, and bacon. High-fat dairy items, such as whole milk, butter, and cream cheese. Eat small, frequent meals instead of large meals. Avoid drinking large amounts of liquid with your meals. Avoid eating meals during the 2-3 hours before bedtime. Avoid lying down right after you eat. Do not exercise right after you eat. Lifestyle  Do not use any products that contain nicotine or tobacco. These products include cigarettes, chewing tobacco, and vaping devices, such as e-cigarettes. If you need help quitting, ask your health care provider. Try to reduce your stress by using methods such as yoga or meditation. If you need help reducing stress, ask your health care provider. If you are overweight, reduce your weight to an amount that is healthy for you. Ask your health care provider for guidance about a safe weight loss goal. General instructions Pay attention to any changes in your symptoms. Take over-the-counter and prescription medicines only as told by your health care provider. Do not take aspirin, ibuprofen, or other NSAIDs unless your health care provider told you to take these medicines. Wear loose-fitting clothing. Do not wear anything tight around your waist that causes pressure on your abdomen. Raise (elevate) the head of your bed about 6 inches (15 cm). You can use a wedge to do this. Avoid bending over if this makes your symptoms worse. Keep all follow-up visits. This is important. Contact a health care provider if: You have: New symptoms. Unexplained weight loss. Difficulty swallowing or it hurts to swallow. Wheezing or a persistent cough. A hoarse voice. Your symptoms do not improve with  treatment. Get help right away if: You have sudden pain in your arms, neck, jaw, teeth, or back. You suddenly feel sweaty, dizzy, or light-headed. You have chest pain or shortness of breath. You vomit and the vomit is green, yellow, or black, or it looks like blood or coffee grounds. You faint. You have stool that is red, bloody, or black. You cannot swallow, drink, or eat. These symptoms may represent a serious problem that is an emergency. Do not wait to see if the symptoms will go away. Get medical help right away. Call your local emergency services (911 in the [place] Do not drive yourself to the hospital. Summary Gastroesophageal reflux happens when acid from the stomach flows up into the esophagus. GERD is a disease in which the reflux happens often, causes frequent or severe symptoms, or causes problems such as damage to the esophagus. Treatment for this condition may vary depending on how severe your symptoms are. Your health care provider may recommend diet and lifestyle changes, medicine, or surgery. Contact a health care provider if you have new or worsening symptoms. Take over-the-counter and prescription medicines only as told by your health care provider. Do not take aspirin, ibuprofen, or other NSAIDs unless your health care provider told you to do so. Keep all follow-up visits as told by your health care provider. This is important. This information is not intended to replace advice given to you by your health care provider.  Make sure you discuss any questions you have with your health care provider. Document Revised: 12/03/2019 Document Reviewed: 12/03/2019 Elsevier Patient Education  2023 [employer].   I appreciate the  opportunity to care for you  Thank You   Marsa Aris , MD

## 2022-09-22 ENCOUNTER — Telehealth: Payer: Self-pay | Admitting: Gastroenterology

## 2022-09-22 NOTE — Telephone Encounter (Signed)
PT had an appointment yesterday and they discussed having a barium swallow test done. She doesn't want to have it done and would like to speak to someone about her concerns. Please advise.

## 2022-09-23 NOTE — Telephone Encounter (Signed)
Spoke with the patient. She has declined to have the modified barium swallow. She does not feel her symptoms are severe. She will continue "like I am doing" and she will call us if she changes her mind or has new concerns. She thinks some of her issues my be from a "small pill in my wet mouth and not enough water, it gets stuck." Patient states, "I can swallow 6 pills at one time."  Encourage her to call us with any GI issues or concerns.

## 2022-10-18 DIAGNOSIS — D696 Thrombocytopenia, unspecified: Secondary | ICD-10-CM | POA: Diagnosis not present

## 2022-10-18 DIAGNOSIS — K219 Gastro-esophageal reflux disease without esophagitis: Secondary | ICD-10-CM | POA: Diagnosis not present

## 2022-10-18 DIAGNOSIS — R7303 Prediabetes: Secondary | ICD-10-CM | POA: Diagnosis not present

## 2022-10-18 DIAGNOSIS — I2510 Atherosclerotic heart disease of native coronary artery without angina pectoris: Secondary | ICD-10-CM | POA: Diagnosis not present

## 2022-10-29 DIAGNOSIS — J069 Acute upper respiratory infection, unspecified: Secondary | ICD-10-CM | POA: Diagnosis not present

## 2022-11-08 DIAGNOSIS — R42 Dizziness and giddiness: Secondary | ICD-10-CM | POA: Diagnosis not present

## 2022-11-08 DIAGNOSIS — H903 Sensorineural hearing loss, bilateral: Secondary | ICD-10-CM | POA: Diagnosis not present

## 2022-11-12 ENCOUNTER — Telehealth: Payer: Self-pay | Admitting: Cardiology

## 2022-11-12 NOTE — Telephone Encounter (Signed)
Spoke with pt, aware of the appointment Wednesday date, time and location. Aware on previous ECG's she did have some pac's and we hope that is all she is having.

## 2022-11-12 NOTE — Telephone Encounter (Signed)
Patient c/o Palpitations:  High priority if patient c/o lightheadedness, shortness of breath, or chest pain  How long have you had palpitations/irregular HR/ Afib? She states she had a visiting nurse come in and she heard an irregular heart beat. Are you having the symptoms now? yes  Are you currently experiencing lightheadedness, SOB or CP? no  Do you have a history of afib (atrial fibrillation) or irregular heart rhythm? Patient states she could feel PVC's ever now and then.   Have you checked your BP or HR? (document readings if available): HR 55, BP128/76  Are you experiencing any other symptoms? Patient states he has been having some lightheadedness in the past.    The visiting nurse recommended she give [place] a call. She states she has a trip coming up later this month to the [place].

## 2022-11-12 NOTE — Telephone Encounter (Signed)
Patient stated this morning the visit nurse came by her house , during her assessment the nurse advised the pt she has a abnormal heart beat and was advised to contact PCP and cardiologist. BP 120/76 and 58 pulse. Pt stated for about a month  c/o lightheaded and mild chest pain in the middle of the night. She did not take nitroglycerin " the pain was not that bad and it did not last long". Attempt to schedule appointment and phone call was disconnected. Called 3 x and it went to voicemail. Will forward to MD and nurse for advise.

## 2022-11-16 NOTE — Progress Notes (Unsigned)
Office Visit    Patient Name: Holly Conley Date of Encounter: 11/16/2022  Primary Care Provider:  Johny Blamer, MD Primary Cardiologist:  Olga Millers, MD Primary Electrophysiologist: None   Past Medical History    Past Medical History:  Diagnosis Date   Anxiety    Anxiety    Arthritis    knees and hands   Colon polyps    Depression    Dizziness    DJD (degenerative joint disease)    GERD (gastroesophageal reflux disease)    Glaucoma    High blood pressure    Hyperlipidemia    Hypertension    Insomnia    Multiple allergies    Osteopenia    Osteoporosis    R and L hip   Panic attacks    Paresthesias    Prediabetes    PVC (premature ventricular contraction)    Restless leg syndrome    Thrombocytopenia (HCC)    Vertigo    BPPV   Vitamin D deficiency    Past Surgical History:  Procedure Laterality Date   APPENDECTOMY     CATARACT EXTRACTION, BILATERAL     closed reduction Right    foot   DILATION AND CURETTAGE OF UTERUS     FOOT SURGERY Left    titanium L ankle   TONSILLECTOMY AND ADENOIDECTOMY     TUBAL LIGATION     VAGINAL HYSTERECTOMY      Allergies  Allergies  Allergen Reactions   Codeine     nauseated   Morphine And Codeine     nauseated   Penicillins      History of Present Illness    Holly Conley  is a 87 year old ***with a PMH of female CAD, HTN, HLD, prediabetes, vertigo, GERD who presents today for complaint of abnormal heartbeat.  Holly Conley was seen initially in 2023 by Dr. Jens Som for evaluation of chest pain.  She underwent a coronary CTA with calcium score of 14.5 and severe (70-99% soft plaque stenosis in mid LAD with FFR suggesting nonflow limiting lesion.  She was seen in follow-up by Bernadene Person, NP and had ongoing complaint of fatigue and intermittent chest pain.  She was offered cardiac catheterization at that time but declined and has been treated medically.  She was last seen by Dr. Jens Som on 08/31/2022 and was  doing well with complaint of shoulder pain that was noncardiac in nature.  She contacted our office on 11/12/2022 with complaint of irregular heartbeat that was discovered by home health nurse.  She also had complaints of lightheadedness and mild chest pain that patient reported did not last long.   Since last being seen in the office patient reports***.  Patient denies chest pain, palpitations, dyspnea, PND, orthopnea, nausea, vomiting, dizziness, syncope, edema, weight gain, or early satiety.     ***Notes: -  Home Medications    Current Outpatient Medications  Medication Sig Dispense Refill   aspirin EC 81 MG tablet Take 81 mg by mouth daily. Swallow whole.     CALCIUM-MAGNESIUM-ZINC PO Take 1 tablet by mouth daily.     cholecalciferol (VITAMIN D) 1000 UNITS tablet Take 2,000 Units by mouth 2 (two) times daily.      cyclobenzaprine (FLEXERIL) 10 MG tablet Take 10 mg by mouth 3 (three) times daily as needed for muscle spasms.     diazepam (VALIUM) 10 MG tablet Take 10 mg by mouth every 6 (six) hours as needed for anxiety.  famotidine (PEPCID) 20 MG tablet Take 20 mg by mouth 2 (two) times daily.     Garlic 1000 MG CAPS See admin instructions.     ibuprofen (ADVIL) 200 MG tablet 1 tablet with food or milk as needed     latanoprost (XALATAN) 0.005 % ophthalmic solution Place 1 drop into both eyes at bedtime.   1   Multiple Vitamin (MULTIVITAMIN) tablet Take 1 tablet by mouth daily.     nitroGLYCERIN (NITROSTAT) 0.4 MG SL tablet Place 1 tablet (0.4 mg total) under the tongue every 5 (five) minutes as needed for chest pain. (Patient not taking: Reported on 09/21/2022) 25 tablet 11   pantoprazole (PROTONIX) 40 MG tablet TAKE 1 TABLET BY MOUTH TWICE DAILY BEFORE A MEAL 180 tablet 0   Polyethyl Glycol-Propyl Glycol (SYSTANE) 0.4-0.3 % SOLN Apply to eye as needed.     polyethylene glycol (MIRALAX / GLYCOLAX) packet Take 17 g by mouth daily.      Probiotic Product (PROBIOTIC ADVANCED PO) Take 1  capsule by mouth daily.     Propylene Glycol (SYSTANE BALANCE OP) Apply to eye.     rosuvastatin (CRESTOR) 20 MG tablet Take 1 tablet (20 mg total) by mouth daily. 90 tablet 3   timolol (TIMOPTIC) 0.25 % ophthalmic solution Administer 1 drop to both eyes daily.     No current facility-administered medications for this visit.     Review of Systems  Please see the history of present illness.    (+)*** (+)***  All other systems reviewed and are otherwise negative except as noted above.  Physical Exam    Wt Readings from Last 3 Encounters:  09/21/22 129 lb 8 oz (58.7 kg)  08/31/22 131 lb (59.4 kg)  03/25/22 127 lb (57.6 kg)   NF:AOZHY were no vitals filed for this visit.,There is no height or weight on file to calculate BMI.  Constitutional:      Appearance: Healthy appearance. Not in distress.  Neck:     Vascular: JVD normal.  Pulmonary:     Effort: Pulmonary effort is normal.     Breath sounds: No wheezing. No rales. Diminished in the bases Cardiovascular:     Normal rate. Regular rhythm. Normal S1. Normal S2.      Murmurs: There is no murmur.  Edema:    Peripheral edema absent.  Abdominal:     Palpations: Abdomen is soft non tender. There is no hepatomegaly.  Skin:    General: Skin is warm and dry.  Neurological:     General: No focal deficit present.     Mental Status: Alert and oriented to person, place and time.     Cranial Nerves: Cranial nerves are intact.  EKG/LABS/ Recent Cardiac Studies    ECG personally reviewed by me today - ***  Cardiac Studies & Procedures          CT SCANS  CT CORONARY MORPH W/CTA COR W/SCORE 12/03/2021  Addendum 12/03/2021  4:31 PM ADDENDUM REPORT: 12/03/2021 16:29  EXAM: OVER-READ INTERPRETATION  CT CHEST  The following report is an over-read performed by radiologist Dr. Karle Barr [facility] on 12/03/2021. This over-read does not include interpretation of cardiac or coronary anatomy or pathology. The  visualized upper abdomen unremarkable interpretation by the cardiologist is attached.  COMPARISON:  None  FINDINGS: Cardiovascular: Aorta normal caliber. No pericardial effusion. Visualized pulmonary arteries patent.  Mediastinum: Small hiatal hernia. Visualized esophagus unremarkable. No adenopathy.  Lungs: Lungs clear. No pulmonary infiltrate, pleural effusion, or  pneumothorax.  Upper abdomen: Unremarkable  Musculoskeletal: Unremarkable  IMPRESSION: Small hiatal hernia.  No additional noncardiac findings.   Electronically Signed By: Ulyses Southward M.D. On: 12/03/2021 16:29  Narrative CLINICAL DATA:  87 yo female with chest pain  EXAM: Cardiac/Coronary CTA  TECHNIQUE: A non-contrast, gated CT scan was obtained with axial slices of 3 mm through the heart for calcium scoring. Calcium scoring was performed using the Agatston method. A 120 kV prospective, gated, contrast cardiac scan was obtained. Gantry rotation speed was 250 msecs and collimation was 0.6 mm. Two sublingual nitroglycerin tablets (0.8 mg) were given. The 3D data set was reconstructed in 5% intervals of the 35-75% of the R-R cycle. Diastolic phases were analyzed on a dedicated workstation using MPR, MIP, and VRT modes. The patient received 95 cc of contrast.  FINDINGS: Image quality: Excellent.  Noise artifact is: Limited.  Coronary Arteries:  Normal coronary origin.  Left dominance.  Left main: The left main is a large caliber vessel with a normal take off from the left coronary cusp that bifurcates to form a left anterior descending artery and a left circumflex artery. There is no plaque or stenosis.  Left anterior descending artery: The LAD has minimal (0-24) calcified plaque in the proximal vessel and severe (70-99) soft plaque stenosis in the mid vessel. The LAD gives off 2 patent diagonal branches.  Left circumflex artery: The LCX is dominant and patent with no evidence of plaque or  stenosis. The LCX gives off 1 large, patent, branching OM and terminates in posterolateral and PDA.  Right coronary artery: The RCA is non-dominant with normal take off from the right coronary cusp. There is no evidence of plaque or stenosis. The RCA terminates as a PDA and right posterolateral branch without evidence of plaque or stenosis.  Right Atrium: Right atrial size is within normal limits.  Right Ventricle: The right ventricular cavity is within normal limits.  Left Atrium: Left atrial size is normal in size with no left atrial appendage filling defect.  Left Ventricle: The ventricular cavity size is within normal limits. There are no stigmata of prior infarction. There is no abnormal filling defect.  Pulmonary arteries: Normal in size without proximal filling defect.  Pulmonary veins: Normal pulmonary venous drainage.  Pericardium: Normal thickness with no significant effusion or calcium present.  Cardiac valves: The aortic valve is trileaflet without significant calcification. The mitral valve is normal structure without significant calcification.  Aorta: Normal caliber with aortic atherosclerosis.  Extra-cardiac findings: See attached radiology report for non-cardiac structures.  IMPRESSION: 1. Coronary calcium score of 14.5. This was 13 percentile for age-, sex, and race-matched controls.  2. Normal coronary origin with left dominance.  3. Severe (70-99) soft plaque stenosis in the mid LAD.  4. Aortic atherosclerosis.  5. Study will be sent for FFR.  RECOMMENDATIONS: CAD-RADS 4: Severe stenosis. (70-99% or > 50% left main). Cardiac catheterization or CT FFR is recommended. Consider symptom-guided anti-ischemic pharmacotherapy as well as risk factor modification per guideline directed care. Invasive coronary angiography recommended with revascularization per published guideline statements.  Olga Millers, MD  Electronically Signed: By: Olga Millers M.D. On: 12/03/2021 15:10          Risk Assessment/Calculations:   {Does this patient have ATRIAL FIBRILLATION?:[phone]}        Lab Results  Component Value Date   WBC 4.3 01/20/2019   HGB 15.5 (H) 01/20/2019   HCT 45.8 01/20/2019   MCV 94.8 01/20/2019   PLT  145 (L) 01/20/2019   Lab Results  Component Value Date   CREATININE 0.79 01/20/2019   BUN 9 01/20/2019   NA 144 01/20/2019   K 4.2 01/20/2019   CL 108 01/20/2019   CO2 27 01/20/2019   Lab Results  Component Value Date   ALT 17 02/04/2022   AST 19 02/04/2022   ALKPHOS 58 02/04/2022   BILITOT 0.7 02/04/2022   Lab Results  Component Value Date   CHOL 165 02/10/2022   HDL 86 02/10/2022   LDLCALC 61 02/10/2022   TRIG 103 02/10/2022   CHOLHDL 1.9 02/10/2022    No results found for: "HGBA1C"   Assessment & Plan    1.  Irregular heart rate: -Patient had previous PACs on EKG and was evaluated by home health nurse who discovered an irregular heartbeat.   2.  Coronary artery disease: -Patient underwent coronary CTA 11/2021 with calcium score of 14.5 and severe 70-99% soft plaque stenosis in mid LAD with FFR showing nonflow limiting. -  3.  Essential hypertension: -Patient's blood pressure today was*** -Continue***  4.  Hyperlipidemia: -Patient's last LDL cholesterol was*** -Continue***      Disposition: Follow-up with Olga Millers, MD or APP in *** months {Are you ordering a CV Procedure (e.g. stress test, cath, DCCV, TEE, etc)?   Press F2        :[id number]}   Medication Adjustments/Labs and Tests Ordered: Current medicines are reviewed at length with the patient today.  Concerns regarding medicines are outlined above.   Signed, Napoleon Form, Leodis Rains, NP 11/16/2022, 9:49 AM [facility]

## 2022-11-17 ENCOUNTER — Ambulatory Visit (INDEPENDENT_AMBULATORY_CARE_PROVIDER_SITE_OTHER): Payer: Medicare Other

## 2022-11-17 ENCOUNTER — Ambulatory Visit: Payer: Medicare Other | Attending: Nurse Practitioner | Admitting: Nurse Practitioner

## 2022-11-17 ENCOUNTER — Encounter: Payer: Self-pay | Admitting: Nurse Practitioner

## 2022-11-17 VITALS — BP 128/80 | HR 60 | Ht 61.0 in | Wt 126.8 lb

## 2022-11-17 DIAGNOSIS — K2100 Gastro-esophageal reflux disease with esophagitis, without bleeding: Secondary | ICD-10-CM

## 2022-11-17 DIAGNOSIS — R42 Dizziness and giddiness: Secondary | ICD-10-CM

## 2022-11-17 DIAGNOSIS — I10 Essential (primary) hypertension: Secondary | ICD-10-CM | POA: Diagnosis not present

## 2022-11-17 DIAGNOSIS — E785 Hyperlipidemia, unspecified: Secondary | ICD-10-CM

## 2022-11-17 DIAGNOSIS — R0789 Other chest pain: Secondary | ICD-10-CM | POA: Diagnosis not present

## 2022-11-17 DIAGNOSIS — I499 Cardiac arrhythmia, unspecified: Secondary | ICD-10-CM

## 2022-11-17 DIAGNOSIS — R002 Palpitations: Secondary | ICD-10-CM

## 2022-11-17 DIAGNOSIS — I2510 Atherosclerotic heart disease of native coronary artery without angina pectoris: Secondary | ICD-10-CM

## 2022-11-17 DIAGNOSIS — R1319 Other dysphagia: Secondary | ICD-10-CM

## 2022-11-17 MED ORDER — PANTOPRAZOLE SODIUM 40 MG PO TBEC: 40.0000 mg | DELAYED_RELEASE_TABLET | Freq: Two times a day (BID) | ORAL | 0 refills | Status: DC

## 2022-11-17 NOTE — Progress Notes (Unsigned)
Applied a 7 day Zio XT monitor to patient in the office  [place] to read

## 2022-11-17 NOTE — Patient Instructions (Addendum)
Medication Instructions:  STOP Famotidine START Protonix 40mg  Take 1 tablet once a day  *If you need a refill on your cardiac medications before your next appointment, please call your pharmacy*   Lab Work: None Ordered If you have labs (blood work) drawn today and your tests are completely normal, you will receive your results only by: MyChart Message (if you have MyChart) OR A paper copy in the mail If you have any lab test that is abnormal or we need to change your treatment, we will call you to review the results.   Testing/Procedures: Holly Conley- Long Term Monitor Instructions  Your physician has requested you wear a ZIO patch monitor for 14 days.  This is a single patch monitor. [employer] supplies one patch monitor per enrollment. Additional stickers are not available. Please do not apply patch if you will be having a Nuclear Stress Test,  Echocardiogram, Cardiac CT, MRI, or Chest Xray during the period you would be wearing the  monitor. The patch cannot be worn during these tests. You cannot remove and re-apply the  ZIO XT patch monitor.  Your ZIO patch monitor will be mailed 3 day USPS to your address on file. It may take 3-5 days  to receive your monitor after you have been enrolled.  Once you have received your monitor, please review the enclosed instructions. Your monitor  has already been registered assigning a specific monitor serial # to you.  Billing and Patient Assistance Program Information  We have supplied [employer] with any of your insurance information on file for billing purposes. [employer] offers a sliding scale Patient Assistance Program for patients that do not have  insurance, or whose insurance does not completely cover the cost of the ZIO monitor.  You must apply for the Patient Assistance Program to qualify for this discounted rate.  To apply, please call [employer] at [phone], select option 4, select option 2, ask to apply for  Patient Assistance Program.  Meredeth Ide will ask your household income, and how many people  are in your household. They will quote your out-of-pocket cost based on that information.  [employer] will also be able to set up a 45-month, interest-free payment plan if needed.  Applying the monitor   Shave hair from upper left chest.  Hold abrader disc by orange tab. Rub abrader in 40 strokes over the upper left chest as  indicated in your monitor instructions.  Clean area with 4 enclosed alcohol pads. Let dry.  Apply patch as indicated in monitor instructions. Patch will be placed under collarbone on left  side of chest with arrow pointing upward.  Rub patch adhesive wings for 2 minutes. Remove white label marked "1". Remove the white  label marked "2". Rub patch adhesive wings for 2 additional minutes.  While looking in a mirror, press and release button in center of patch. A small green light will  flash 3-4 times. This will be your only indicator that the monitor has been turned on.  Do not shower for the first 24 hours. You may shower after the first 24 hours.  Press the button if you feel a symptom. You will hear a small click. Record Date, Time and  Symptom in the Patient Logbook.  When you are ready to remove the patch, follow instructions on the last 2 pages of Patient  Logbook. Stick patch monitor onto the last page of Patient Logbook.  Place Patient Logbook in the blue and white box. Use locking tab on box and  tape box closed  securely. The blue and white box has prepaid postage on it. Please place it in the mailbox as  soon as possible. Your physician should have your test results approximately 7 days after the  monitor has been mailed back to [facility].  Call [facility] Customer Care at [phone] if you have questions regarding  your ZIO XT patch monitor. Call them immediately if you see an orange light blinking on your  monitor.  If your monitor falls off in less than 4 days, contact our Monitor  department at [phone].  If your monitor becomes loose or falls off after 4 days call [employer] at [phone] for  suggestions on securing your monitor   Follow-Up: At [facility], you and your health needs are our priority.  As part of our continuing mission to provide you with exceptional heart care, we have created designated Provider Care Teams.  These Care Teams include your primary Cardiologist (physician) and Advanced Practice Providers (APPs -  Physician Assistants and Nurse Practitioners) who all work together to provide you with the care you need, when you need it.  We recommend signing up for the patient portal called "MyChart".  Sign up information is provided on this After Visit Summary.  MyChart is used to connect with patients for Virtual Visits (Telemedicine).  Patients are able to view lab/test results, encounter notes, upcoming appointments, etc.  Non-urgent messages can be sent to your provider as well.   To learn more about what you can do with MyChart, go to [email or web address].    Your next appointment:    Follow up as scheduled  Provider:   Olga Millers, MD     Other Instructions

## 2022-11-29 DIAGNOSIS — I499 Cardiac arrhythmia, unspecified: Secondary | ICD-10-CM | POA: Diagnosis not present

## 2022-11-29 DIAGNOSIS — I10 Essential (primary) hypertension: Secondary | ICD-10-CM | POA: Diagnosis not present

## 2022-12-10 ENCOUNTER — Telehealth: Payer: Self-pay | Admitting: Cardiology

## 2022-12-10 NOTE — Telephone Encounter (Signed)
Patient is requesting call back to go over results from heart monitor. Requesting call back.

## 2022-12-10 NOTE — Telephone Encounter (Signed)
Patient states she does not want to start any new medications at this time. She usually can feel when her heartbeat go over 100. When that happens she comes in and rest and her hr goes back to normal.

## 2022-12-14 ENCOUNTER — Other Ambulatory Visit: Payer: Self-pay | Admitting: Nurse Practitioner

## 2022-12-27 ENCOUNTER — Other Ambulatory Visit: Payer: Self-pay | Admitting: Nurse Practitioner

## 2022-12-31 DIAGNOSIS — K080 Exfoliation of teeth due to systemic causes: Secondary | ICD-10-CM | POA: Diagnosis not present

## 2023-01-06 ENCOUNTER — Telehealth: Payer: Self-pay | Admitting: Cardiology

## 2023-01-06 NOTE — Telephone Encounter (Signed)
Pt states that would like a callback regarding if there is a way to check to see if the Plaque buildup that she had, has now broken up. Please advise

## 2023-01-06 NOTE — Telephone Encounter (Signed)
Patient  wants to know what can be done to see if her Plaque is gone or broken up.  Advised that test would need to be re-done and it is too soon to have this done.  Advised to speak to the provider at her September appointment.  She was unaware of the appt so gave the date and time twice and she wrote information down.

## 2023-01-11 DIAGNOSIS — H401232 Low-tension glaucoma, bilateral, moderate stage: Secondary | ICD-10-CM | POA: Diagnosis not present

## 2023-01-11 DIAGNOSIS — H04123 Dry eye syndrome of bilateral lacrimal glands: Secondary | ICD-10-CM | POA: Diagnosis not present

## 2023-02-10 ENCOUNTER — Other Ambulatory Visit: Payer: Self-pay | Admitting: Family Medicine

## 2023-02-10 DIAGNOSIS — Z1231 Encounter for screening mammogram for malignant neoplasm of breast: Secondary | ICD-10-CM

## 2023-02-18 NOTE — Progress Notes (Signed)
HPI: FU CAD.  ABIs January 2019 normal; bilateral toe brachial indices abnormal.  Coronary CTA June 2023 showed calcified plaque in the proximal LAD and severe soft plaque stenosis in the mid vessel; calcium score 14.5 which was 13th percentile.  FFR suggested mid LAD lesion not flow-limiting.  Patient was seen in follow-up by Bernadene Person and declined catheterization at that time.  She has been treated medically.  Monitor June 2024 showed sinus rhythm with rare PAC, short runs of SVT, rare PVC and couplet.  Since last seen she denies dyspnea on exertion, orthopnea, PND, pedal edema, exertional chest pain or syncope.  She does have some fatigue.  Current Outpatient Medications  Medication Sig Dispense Refill   aspirin EC 81 MG tablet Take 81 mg by mouth daily. Swallow whole.     CALCIUM-MAGNESIUM-ZINC PO Take 1 tablet by mouth daily.     cholecalciferol (VITAMIN D) 1000 UNITS tablet Take 2,000 Units by mouth 2 (two) times daily.      cyclobenzaprine (FLEXERIL) 10 MG tablet Take 10 mg by mouth 3 (three) times daily as needed for muscle spasms.     diazepam (VALIUM) 10 MG tablet Take 10 mg by mouth every 6 (six) hours as needed for anxiety.     Garlic 1000 MG CAPS See admin instructions.     ibuprofen (ADVIL) 200 MG tablet 1 tablet with food or milk as needed     latanoprost (XALATAN) 0.005 % ophthalmic solution Place 1 drop into both eyes at bedtime.   1   Multiple Vitamin (MULTIVITAMIN) tablet Take 1 tablet by mouth daily.     pantoprazole (PROTONIX) 40 MG tablet Take 1 tablet (40 mg total) by mouth 2 (two) times daily before a meal. 180 tablet 0   Polyethyl Glycol-Propyl Glycol (SYSTANE) 0.4-0.3 % SOLN Apply to eye as needed.     polyethylene glycol (MIRALAX / GLYCOLAX) packet Take 17 g by mouth daily.      Probiotic Product (PROBIOTIC ADVANCED PO) Take 1 capsule by mouth daily.     rosuvastatin (CRESTOR) 20 MG tablet TAKE 1 TABLET BY MOUTH DAILY 90 tablet 3   calcium carbonate (TUMS EX)  750 MG chewable tablet Chew 1 tablet by mouth as needed. (Patient not taking: Reported on 03/01/2023)     nitroGLYCERIN (NITROSTAT) 0.4 MG SL tablet Place 1 tablet (0.4 mg total) under the tongue every 5 (five) minutes as needed for chest pain. (Patient not taking: Reported on 09/21/2022) 25 tablet 11   timolol (TIMOPTIC) 0.25 % ophthalmic solution Administer 1 drop to both eyes daily. (Patient not taking: Reported on 03/01/2023)     No current facility-administered medications for this visit.     Past Medical History:  Diagnosis Date   Anxiety    Anxiety    Arthritis    knees and hands   Colon polyps    Depression    Dizziness    DJD (degenerative joint disease)    GERD (gastroesophageal reflux disease)    Glaucoma    High blood pressure    Hyperlipidemia    Hypertension    Insomnia    Multiple allergies    Osteopenia    Osteoporosis    R and L hip   Panic attacks    Paresthesias    Prediabetes    PVC (premature ventricular contraction)    Restless leg syndrome    Thrombocytopenia (HCC)    Vertigo    BPPV   Vitamin D deficiency  Past Surgical History:  Procedure Laterality Date   APPENDECTOMY     CATARACT EXTRACTION, BILATERAL     closed reduction Right    foot   DILATION AND CURETTAGE OF UTERUS     FOOT SURGERY Left    titanium L ankle   TONSILLECTOMY AND ADENOIDECTOMY     TUBAL LIGATION     VAGINAL HYSTERECTOMY      Social History   Socioeconomic History   Marital status: Widowed    Spouse name: Not on file   Number of children: 3   Years of education: Not on file   Highest education level: Not on file  Occupational History   Occupation: retired Charity fundraiser  Tobacco Use   Smoking status: Never   Smokeless tobacco: Never  Substance and Sexual Activity   Alcohol use: Yes    Comment: socially   Drug use: No   Sexual activity: Not on file  Other Topics Concern   Not on file  Social History Narrative   Lives home alone.  Widowed.  Education: [employer] Service [employer].   Children 3.     Social Determinants of Corporate investment banker Strain: Not on file  Food Insecurity: Not on file  Transportation Needs: Not on file  Physical Activity: Not on file  Stress: Not on file  Social Connections: Not on file  Intimate Partner Violence: Not on file    Family History  Problem Relation Age of Onset   Bladder Cancer Mother    Diabetes Mellitus I Mother    Coronary artery disease Mother    Hypertension Mother    Heart failure Father    Heart disease Father    Hypertension Father    Colon polyps Father    Hyperlipidemia Sister    Hypertension Sister    Colon polyps Sister    Esophageal cancer Neg Hx    Liver disease Neg Hx    Breast cancer Neg Hx     ROS: no fevers or chills, productive cough, hemoptysis, dysphasia, odynophagia, melena, hematochezia, dysuria, hematuria, rash, seizure activity, orthopnea, PND, pedal edema, claudication. Remaining systems are negative.  Physical Exam: Well-developed well-nourished in no acute distress.  Skin is warm and dry.  HEENT is normal.  Neck is supple.  Chest is clear to auscultation with normal expansion.  Cardiovascular exam is regular rate and rhythm.  Abdominal exam nontender or distended. No masses palpated. Extremities show no edema. neuro grossly intact  A/P  1 coronary artery disease-patient doing well from a symptomatic standpoint.  She denies exertional chest pain.  Previous FFR negative and she prefers medical therapy.  Continue aspirin and statin.  2 hypertension-blood pressure controlled.    3 hyperlipidemia-continue statin.  Check lipids and liver.  Olga Millers, MD

## 2023-02-23 DIAGNOSIS — M79661 Pain in right lower leg: Secondary | ICD-10-CM | POA: Diagnosis not present

## 2023-03-01 ENCOUNTER — Encounter: Payer: Self-pay | Admitting: Cardiology

## 2023-03-01 ENCOUNTER — Ambulatory Visit: Payer: Medicare Other | Attending: Cardiology | Admitting: Cardiology

## 2023-03-01 VITALS — BP 140/82 | HR 56 | Ht 61.0 in | Wt 129.4 lb

## 2023-03-01 DIAGNOSIS — E785 Hyperlipidemia, unspecified: Secondary | ICD-10-CM

## 2023-03-01 NOTE — Patient Instructions (Signed)
Follow-Up: At [facility], you and your health needs are our priority.  As part of our continuing mission to provide you with exceptional heart care, we have created designated Provider Care Teams.  These Care Teams include your primary Cardiologist (physician) and Advanced Practice Providers (APPs -  Physician Assistants and Nurse Practitioners) who all work together to provide you with the care you need, when you need it.  We recommend signing up for the patient portal called "MyChart".  Sign up information is provided on this After Visit Summary.  MyChart is used to connect with patients for Virtual Visits (Telemedicine).  Patients are able to view lab/test results, encounter notes, upcoming appointments, etc.  Non-urgent messages can be sent to your provider as well.   To learn more about what you can do with MyChart, go to [email or web address].    Your next appointment:   6 month(s)  Provider:   Olga Millers, MD

## 2023-03-02 LAB — HEPATIC FUNCTION PANEL
ALT: 17 IU/L (ref 0–32)
AST: 21 IU/L (ref 0–40)
Albumin: 4.6 g/dL (ref 3.7–4.7)
Alkaline Phosphatase: 64 IU/L (ref 44–121)
Bilirubin Total: 0.6 mg/dL (ref 0.0–1.2)
Bilirubin, Direct: 0.22 mg/dL (ref 0.00–0.40)
Total Protein: 6.7 g/dL (ref 6.0–8.5)

## 2023-03-02 LAB — LIPID PANEL
Chol/HDL Ratio: 1.9 ratio (ref 0.0–4.4)
Cholesterol, Total: 169 mg/dL (ref 100–199)
HDL: 90 mg/dL (ref >39)
LDL Chol Calc (NIH): 60 mg/dL (ref 0–99)
Triglycerides: 112 mg/dL (ref 0–149)
VLDL Cholesterol Cal: 19 mg/dL (ref 5–40)

## 2023-03-04 ENCOUNTER — Encounter: Payer: Self-pay | Admitting: *Deleted

## 2023-03-15 ENCOUNTER — Telehealth: Payer: Self-pay | Admitting: Cardiology

## 2023-03-15 NOTE — Telephone Encounter (Signed)
Patient states 9/24 venipuncture was denied by her insurance and they are sending an appeal letter to the office.

## 2023-03-15 NOTE — Telephone Encounter (Signed)
Advised patient  if this is an error on our part, we will fix this once received.

## 2023-03-22 DIAGNOSIS — H401232 Low-tension glaucoma, bilateral, moderate stage: Secondary | ICD-10-CM | POA: Diagnosis not present

## 2023-03-22 DIAGNOSIS — H0100A Unspecified blepharitis right eye, upper and lower eyelids: Secondary | ICD-10-CM | POA: Diagnosis not present

## 2023-03-22 DIAGNOSIS — H0100B Unspecified blepharitis left eye, upper and lower eyelids: Secondary | ICD-10-CM | POA: Diagnosis not present

## 2023-03-22 DIAGNOSIS — H04123 Dry eye syndrome of bilateral lacrimal glands: Secondary | ICD-10-CM | POA: Diagnosis not present

## 2023-04-05 DIAGNOSIS — L578 Other skin changes due to chronic exposure to nonionizing radiation: Secondary | ICD-10-CM | POA: Diagnosis not present

## 2023-04-05 DIAGNOSIS — L570 Actinic keratosis: Secondary | ICD-10-CM | POA: Diagnosis not present

## 2023-04-05 DIAGNOSIS — D225 Melanocytic nevi of trunk: Secondary | ICD-10-CM | POA: Diagnosis not present

## 2023-04-05 DIAGNOSIS — D2230 Melanocytic nevi of unspecified part of face: Secondary | ICD-10-CM | POA: Diagnosis not present

## 2023-04-05 DIAGNOSIS — L821 Other seborrheic keratosis: Secondary | ICD-10-CM | POA: Diagnosis not present

## 2023-04-07 ENCOUNTER — Other Ambulatory Visit: Payer: Self-pay | Admitting: Nurse Practitioner

## 2023-04-28 DIAGNOSIS — I25118 Atherosclerotic heart disease of native coronary artery with other forms of angina pectoris: Secondary | ICD-10-CM | POA: Diagnosis not present

## 2023-04-28 DIAGNOSIS — R002 Palpitations: Secondary | ICD-10-CM | POA: Diagnosis not present

## 2023-04-28 DIAGNOSIS — Z0000 Encounter for general adult medical examination without abnormal findings: Secondary | ICD-10-CM | POA: Diagnosis not present

## 2023-04-28 DIAGNOSIS — R7303 Prediabetes: Secondary | ICD-10-CM | POA: Diagnosis not present

## 2023-04-28 DIAGNOSIS — I10 Essential (primary) hypertension: Secondary | ICD-10-CM | POA: Diagnosis not present

## 2023-04-28 DIAGNOSIS — D696 Thrombocytopenia, unspecified: Secondary | ICD-10-CM | POA: Diagnosis not present

## 2023-04-28 DIAGNOSIS — E559 Vitamin D deficiency, unspecified: Secondary | ICD-10-CM | POA: Diagnosis not present

## 2023-04-28 DIAGNOSIS — R202 Paresthesia of skin: Secondary | ICD-10-CM | POA: Diagnosis not present

## 2023-05-04 DIAGNOSIS — H524 Presbyopia: Secondary | ICD-10-CM | POA: Diagnosis not present

## 2023-05-10 ENCOUNTER — Ambulatory Visit
Admission: RE | Admit: 2023-05-10 | Discharge: 2023-05-10 | Disposition: A | Discharge status: Home / Self Care | Payer: Medicare Other | Source: Ambulatory Visit | Attending: Family Medicine | Admitting: Family Medicine

## 2023-05-10 DIAGNOSIS — Z1231 Encounter for screening mammogram for malignant neoplasm of breast: Secondary | ICD-10-CM

## 2023-05-20 ENCOUNTER — Other Ambulatory Visit: Payer: Self-pay | Admitting: Family Medicine

## 2023-05-20 DIAGNOSIS — Z1231 Encounter for screening mammogram for malignant neoplasm of breast: Secondary | ICD-10-CM

## 2023-06-03 ENCOUNTER — Telehealth: Payer: Self-pay | Admitting: Cardiology

## 2023-06-03 MED ORDER — ROSUVASTATIN CALCIUM 10 MG PO TABS
10.0000 mg | ORAL_TABLET | Freq: Every day | ORAL | Status: DC
Start: 2023-06-03 — End: 2023-10-07

## 2023-06-03 NOTE — Telephone Encounter (Signed)
Pt c/o medication issue:  1. Name of Medication:   rosuvastatin (CRESTOR) 20 MG tablet    2. How are you currently taking this medication (dosage and times per day)?   TAKE 1 TABLET BY MOUTH DAILY    3. Are you having a reaction (difficulty breathing--STAT)? No  4. What is your medication issue? Pt states that she thinks medication may be causing her to have joint pain. She would like a c/b regarding this matter. Please advise

## 2023-06-03 NOTE — Telephone Encounter (Signed)
Spoke to patient, reports she can not tolerate 20 mg rosuvastatin due to severe myalgia and joint pain, advice to start taking 10 mg dose see if could tolerate. Will follow up in 2 weeks via phone.   LDLc was 60 on 20 mg dose so we might need to add Zetia to max tolerate dose of Crestor.

## 2023-06-25 IMAGING — MG MM DIGITAL SCREENING BILAT W/ TOMO AND CAD
8 series · 8 of 24 positions shown · non-contrast
Comparison: Previous exam(s).

CLINICAL DATA: Screening.

EXAM:
DIGITAL SCREENING BILATERAL MAMMOGRAM WITH TOMOSYNTHESIS AND CAD
TECHNIQUE: Bilateral screening digital craniocaudal and mediolateral oblique
mammograms were obtained. Bilateral screening digital breast
tomosynthesis was performed. The images were evaluated with
computer-aided detection.

[R CC synth-2D]
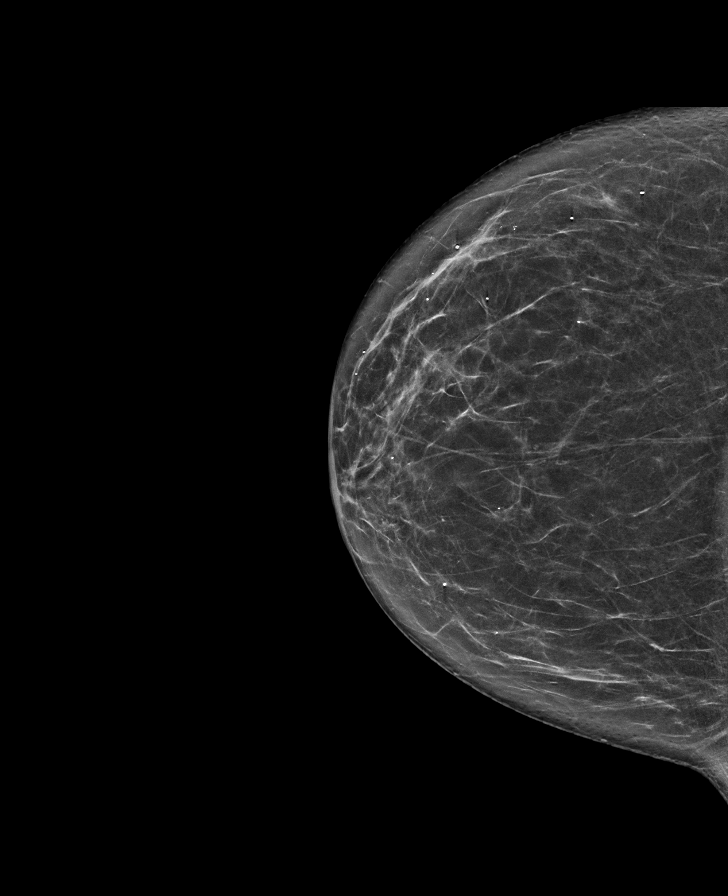

[L CC synth-2D]
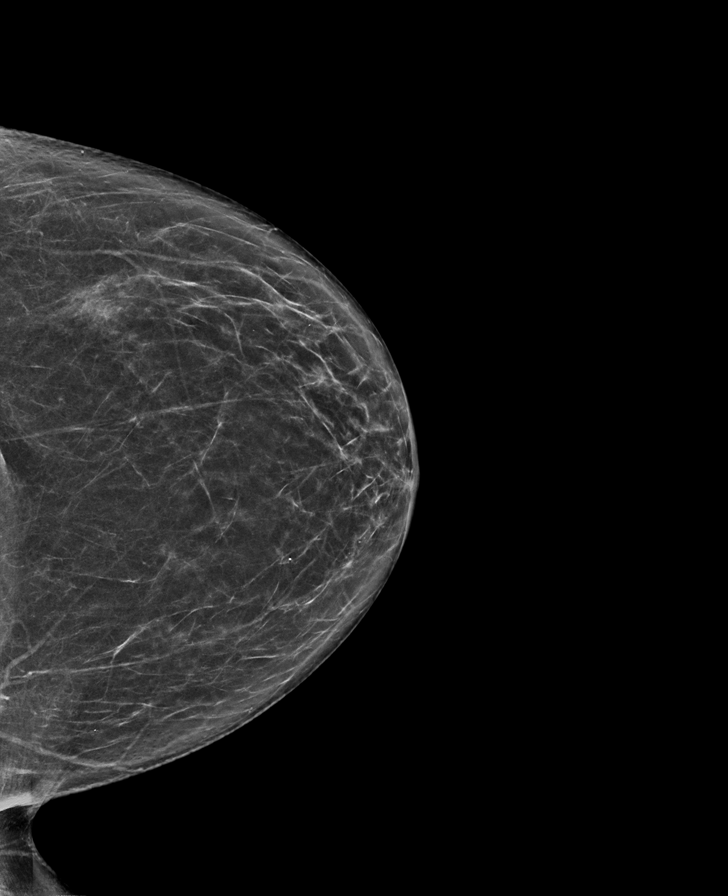

[R MLO synth-2D]
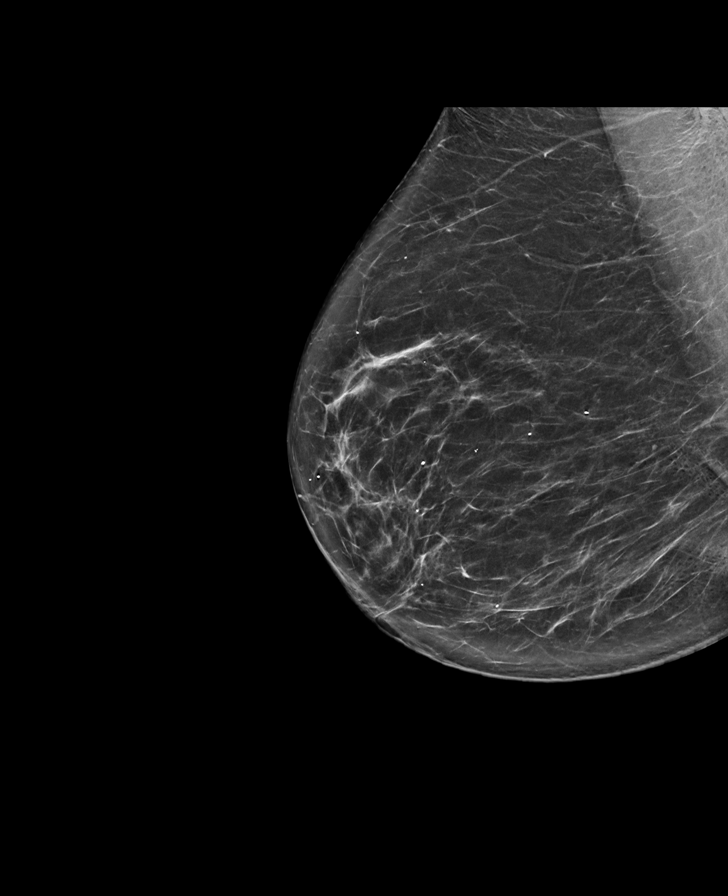

[L MLO synth-2D]
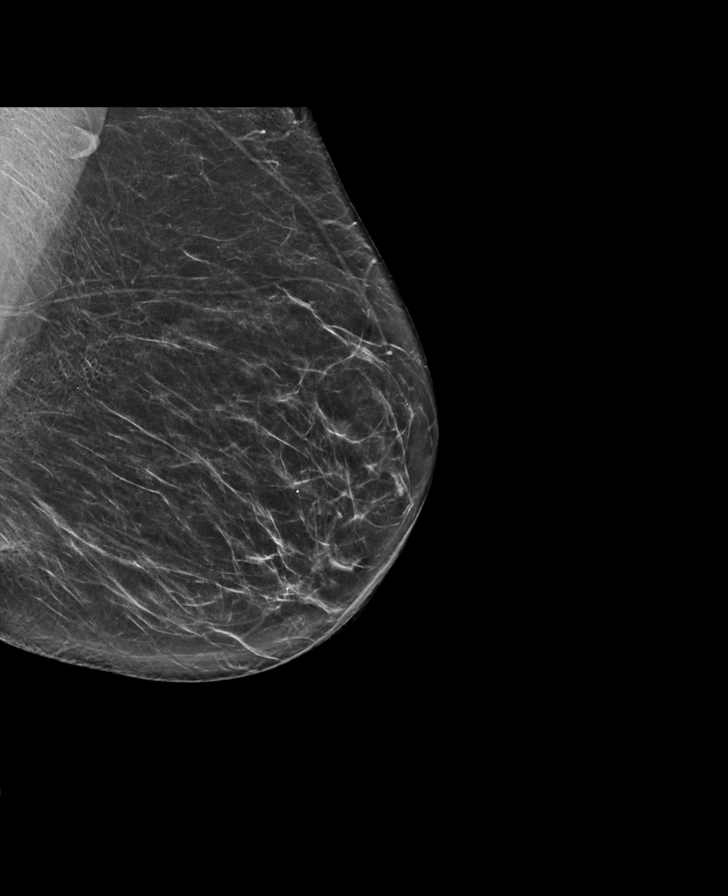

[R CC tomo · tomo slice 37/73.0]
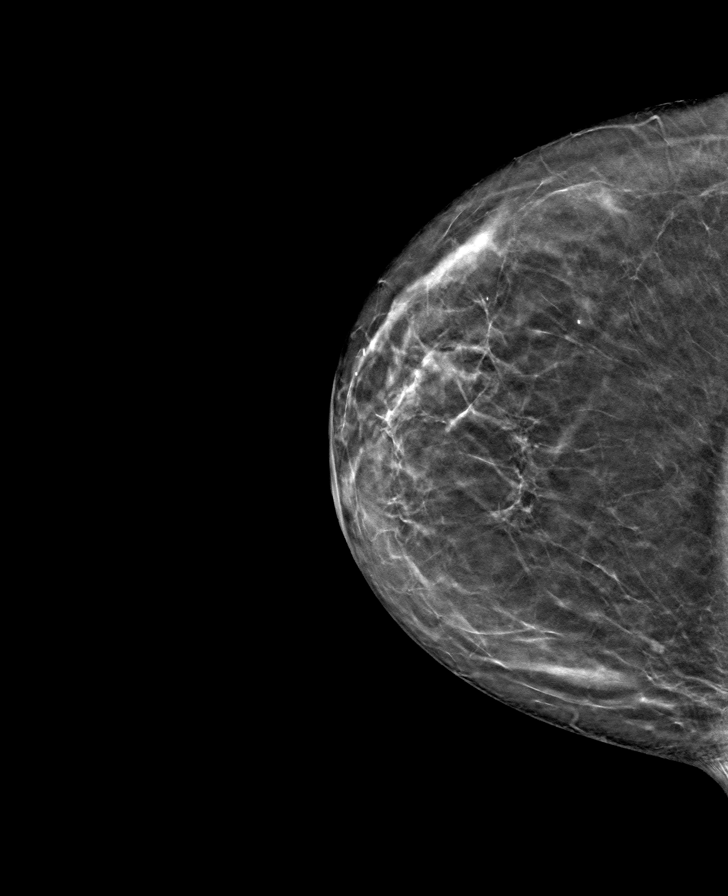

[L MLO tomo · tomo slice 39/77.0]
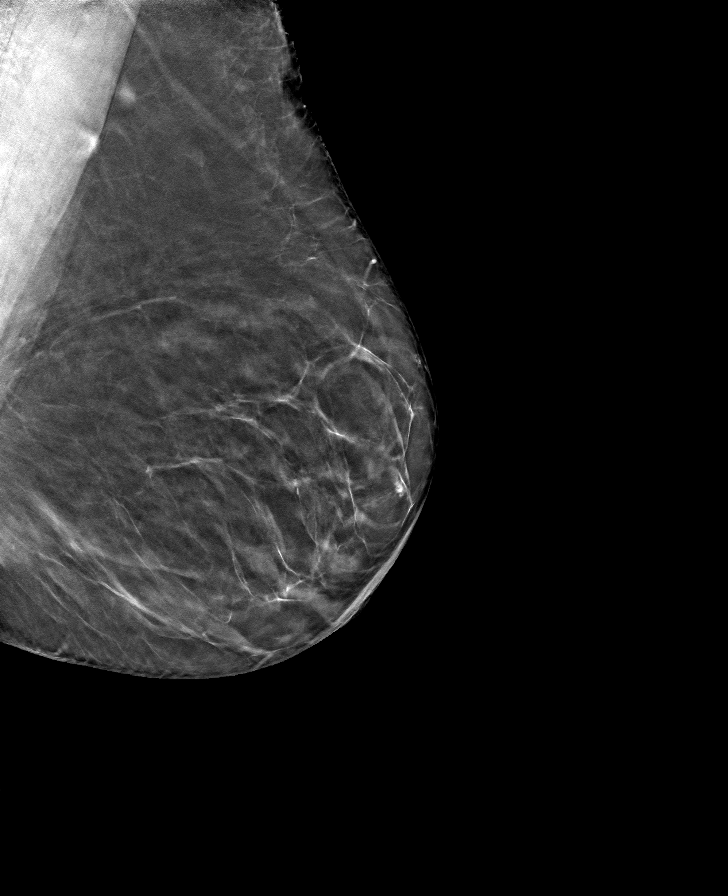

[R MLO tomo · tomo slice 41/81.0]
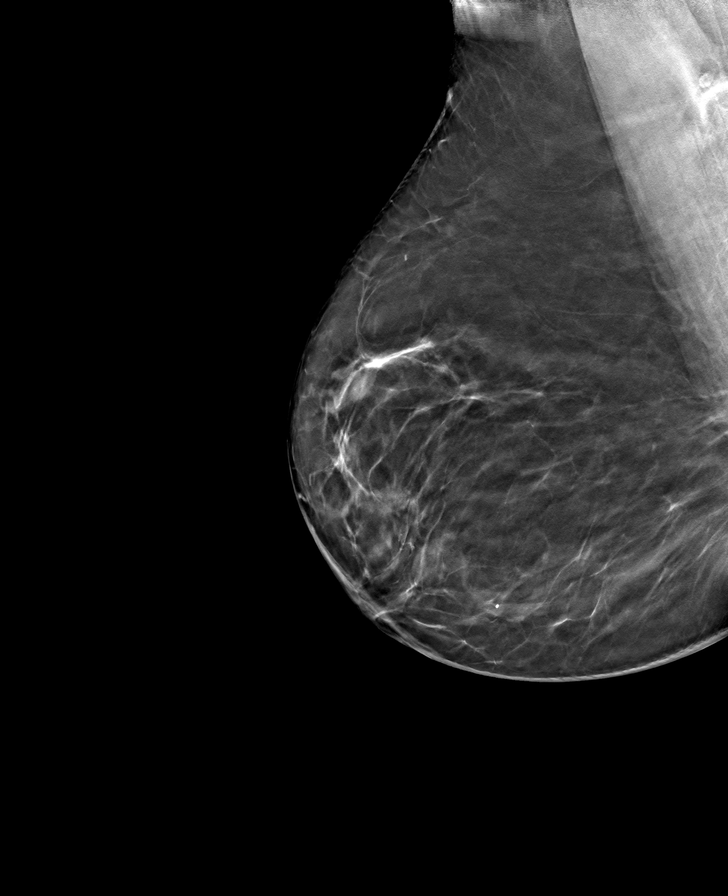

[L CC tomo · tomo slice 35/68.0]
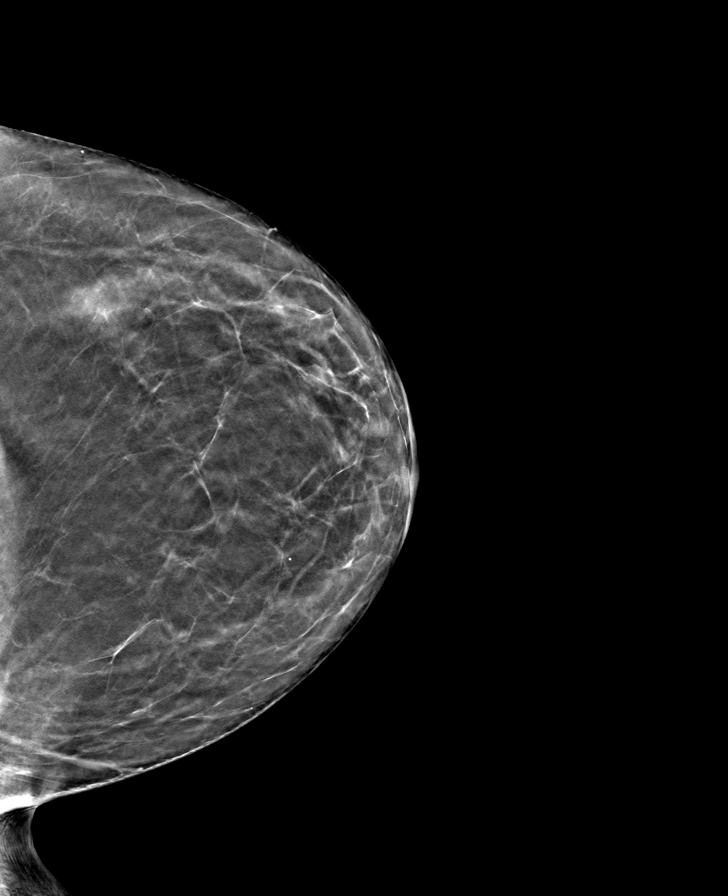

[8 of 24 positions shown; findings below may reference images not displayed]

ACR Breast Density Category b: There are scattered areas of
fibroglandular density.
FINDINGS: There are no findings suspicious for malignancy.
IMPRESSION: No mammographic evidence of malignancy. A result letter of this
screening mammogram will be mailed directly to the patient.

RECOMMENDATION:
Screening mammogram in one year. (Code:51-O-LD2)

BI-RADS CATEGORY  1: Negative.

## 2023-07-05 NOTE — Telephone Encounter (Signed)
Spoke to patient, she still has joint pain from 10 mg rosuvastatin. Suggest to take 10 mg every other day and f/u via phone in 4 weeks

## 2023-07-07 DIAGNOSIS — K080 Exfoliation of teeth due to systemic causes: Secondary | ICD-10-CM | POA: Diagnosis not present

## 2023-07-20 DIAGNOSIS — H401232 Low-tension glaucoma, bilateral, moderate stage: Secondary | ICD-10-CM | POA: Diagnosis not present

## 2023-07-20 DIAGNOSIS — H26493 Other secondary cataract, bilateral: Secondary | ICD-10-CM | POA: Diagnosis not present

## 2023-07-20 DIAGNOSIS — H43813 Vitreous degeneration, bilateral: Secondary | ICD-10-CM | POA: Diagnosis not present

## 2023-07-20 DIAGNOSIS — H04123 Dry eye syndrome of bilateral lacrimal glands: Secondary | ICD-10-CM | POA: Diagnosis not present

## 2023-08-01 NOTE — Telephone Encounter (Signed)
 Spoke to pt, taking rosuvastatin 10 mg daily and tolerating it well. Will be seeing NP on March 26 , will get f/u lipid lab at that appointment. LDLc was 61 while on Crestor 40 mg dose. If LDLc elevated will optimize therapy with [id number].

## 2023-08-30 NOTE — Progress Notes (Unsigned)
 Cardiology Office Note    Date:  08/31/2023  ID:  ROSELINDA BAHENA, DOB 20-May-1936, MRN [id number] PCP:  Noberto Retort, MD  Cardiologist:  Olga Millers, MD  Electrophysiologist:  None   Chief Complaint: Follow-up for CAD  History of Present Illness: .    Holly Conley is a 88 y.o. female with visit-pertinent history of CAD, intermittent chest pain, hypertension, hyperlipidemia, prediabetes, BPPV, thrombocytopenia, osteoporosis, GERD, DJD and anxiety.  In June 2023 patient was referred to Dr. Jens Som by her PCP in setting of intermittent nonradiating substernal chest pain.  ABIs in January 2019 were normal.  Coronary CTA in June 2023 revealed coronary calcium score of 14.5, 13 percentile for age, sex and race matched control, severe 70 to 99% soft plaque stenosis in mid LAD, FFR suggested mid LAD lesion was not flow-limiting.  Patient was seen in follow-up and reporting intermittent chest pain, cardiac catheterization was discussed and patient declined.  In 11/2022 patient reported episodes of dizziness, chest discomfort and irregular heartbeats.  Cardiac monitor indicated an average heart rate of 65 bpm, predominant underlying rhythm was sinus rhythm, she had 3 runs of supraventricular tachycardia, run with the fastest interval lasting 9 beats with a max rate of 146 bpm, longest lasting 9 beats with an average rate of 119 bpm.  Triggers were associated with SVT.  Isolated PACs were occasional at 3.8% burden.  Patient declined any medication management.  Patient was last seen in clinic on 03/01/2023 by Dr. Jens Som.  She remained stable from a cardiac standpoint, did note some ongoing fatigue.  Today she presents for follow-up.  She reports that she has been doing well. She notes some chest discomfort related to GERD, improves with TUMs. She regularly gardens and tolerates well. Reports she has been having significantly more energy lately, reports that she has been feeling significantly  better than she has in recent months.  She does note that she tries to eat healthy however eats processed foods as she does not favor cooking in the afternoons.  She also notes that she has been having problems with constipation lately, has also had problems with her dentures but has a dental appointment next week.  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  She does report 2 episodes in which her Apple Watch notified her of heart rates in the 40s at night when at rest.  Patient reports that she got up and walked around with improvement in heart rate.  Patient denies any symptoms of dizziness, lightheadedness, presyncope or syncope.  She denies experiencing any low heart rates during the day.  ROS: .   Today she denies chest pain, shortness of breath, lower extremity edema, fatigue, melena, hematuria, hemoptysis, diaphoresis, weakness, presyncope, syncope, orthopnea, and PND.  All other systems are reviewed and otherwise negative. Studies Reviewed: Marland Kitchen    EKG:  EKG is ordered today, personally reviewed, demonstrating  EKG Interpretation Date/Time:  Wednesday August 31 2023 14:11:55 EDT Ventricular Rate:  62 PR Interval:  144 QRS Duration:  74 QT Interval:  434 QTC Calculation: 440 R Axis:   11  Text Interpretation: Sinus rhythm with marked sinus arrhythmia Nonspecific T wave abnormality, simliar to prior tracings Confirmed by Reather Littler [id number]) on 08/31/2023 5:15:49 PM   CV Studies: Cardiac studies reviewed are outlined and summarized above. Otherwise please see EMR for full report. Cardiac Studies & Procedures   ______________________________________________________________________________________________        Lisabeth Pick  LONG TERM MONITOR (3-14 DAYS) 11/29/2022  Narrative Patch Wear Time:  7 days and 0 hours (2024-06-12T09:11:36-0400 to 2024-06-19T09:27:59-0400)  Patient had a min HR of 47 bpm, max HR of 146 bpm, and avg HR of 65 bpm. Predominant  underlying rhythm was Sinus Rhythm. 3 Supraventricular Tachycardia runs occurred, the run with the fastest interval lasting 9 beats with a max rate of 146 bpm, the longest lasting 9 beats with an avg rate of 119 bpm. Supraventricular Tachycardia was detected within +/- 45 seconds of symptomatic patient event(s). Isolated SVEs were occasional (3.8%, [id number]), SVE Couplets were rare (<1.0%, 193), and SVE Triplets were rare (<1.0%, 19). Isolated VEs were rare (<1.0%), VE Couplets were rare (<1.0%), and no VE Triplets were present.  Sinus bradycardia, normal sinus rhythm, sinus tachycardia, rare PACs, short (9 beats) run of SVT, rare PVC and couplet. Olga Millers, MD   CT SCANS  CT CORONARY FRACTIONAL FLOW RESERVE DATA PREP 12/03/2021  Narrative EXAM: FFRCT ANALYSIS  FINDINGS: FFRct analysis was performed on the original cardiac CT angiogram dataset. Diagrammatic representation of the FFRct analysis is provided in a separate PDF document in PACS. This dictation was created using the PDF document and an interactive 3D model of the results. 3D model is not available in the EMR/PACS. Normal FFR range is >0.80.  1. Left Main: findings 1.0, 1.0  2. LAD: findings 0.95, 0.93, 0.85  3. LCX: findings 0.91 4. RCA: findings 0.97  IMPRESSION: FFR suggests mid LAD lesion is not flow limiting.  Note: These examples are not recommendations of [employer] and only provided as examples of what other customers are doing.   Electronically Signed By: Olga Millers M.D. On: 12/03/2021 16:05   CT CORONARY MORPH W/CTA COR W/SCORE 12/03/2021  Addendum 12/03/2021  4:31 PM ADDENDUM REPORT: 12/03/2021 16:29  EXAM: OVER-READ INTERPRETATION  CT CHEST  The following report is an over-read performed by radiologist Dr. Karle Barr [facility] on 12/03/2021. This over-read does not include interpretation of cardiac or coronary anatomy or pathology. The visualized upper abdomen unremarkable  interpretation by the cardiologist is attached.  COMPARISON:  None  FINDINGS: Cardiovascular: Aorta normal caliber. No pericardial effusion. Visualized pulmonary arteries patent.  Mediastinum: Small hiatal hernia. Visualized esophagus unremarkable. No adenopathy.  Lungs: Lungs clear. No pulmonary infiltrate, pleural effusion, or pneumothorax.  Upper abdomen: Unremarkable  Musculoskeletal: Unremarkable  IMPRESSION: Small hiatal hernia.  No additional noncardiac findings.   Electronically Signed By: Ulyses Southward M.D. On: 12/03/2021 16:29  Narrative CLINICAL DATA:  88 yo female with chest pain  EXAM: Cardiac/Coronary CTA  TECHNIQUE: A non-contrast, gated CT scan was obtained with axial slices of 3 mm through the heart for calcium scoring. Calcium scoring was performed using the Agatston method. A 120 kV prospective, gated, contrast cardiac scan was obtained. Gantry rotation speed was 250 msecs and collimation was 0.6 mm. Two sublingual nitroglycerin tablets (0.8 mg) were given. The 3D data set was reconstructed in 5% intervals of the 35-75% of the R-R cycle. Diastolic phases were analyzed on a dedicated workstation using MPR, MIP, and VRT modes. The patient received 95 cc of contrast.  FINDINGS: Image quality: Excellent.  Noise artifact is: Limited.  Coronary Arteries:  Normal coronary origin.  Left dominance.  Left main: The left main is a large caliber vessel with a normal take off from the left coronary cusp that bifurcates to form a left anterior descending artery and a left circumflex artery. There is no plaque or stenosis.  Left  anterior descending artery: The LAD has minimal (0-24) calcified plaque in the proximal vessel and severe (70-99) soft plaque stenosis in the mid vessel. The LAD gives off 2 patent diagonal branches.  Left circumflex artery: The LCX is dominant and patent with no evidence of plaque or stenosis. The LCX gives off 1 large,  patent, branching OM and terminates in posterolateral and PDA.  Right coronary artery: The RCA is non-dominant with normal take off from the right coronary cusp. There is no evidence of plaque or stenosis. The RCA terminates as a PDA and right posterolateral branch without evidence of plaque or stenosis.  Right Atrium: Right atrial size is within normal limits.  Right Ventricle: The right ventricular cavity is within normal limits.  Left Atrium: Left atrial size is normal in size with no left atrial appendage filling defect.  Left Ventricle: The ventricular cavity size is within normal limits. There are no stigmata of prior infarction. There is no abnormal filling defect.  Pulmonary arteries: Normal in size without proximal filling defect.  Pulmonary veins: Normal pulmonary venous drainage.  Pericardium: Normal thickness with no significant effusion or calcium present.  Cardiac valves: The aortic valve is trileaflet without significant calcification. The mitral valve is normal structure without significant calcification.  Aorta: Normal caliber with aortic atherosclerosis.  Extra-cardiac findings: See attached radiology report for non-cardiac structures.  IMPRESSION: 1. Coronary calcium score of 14.5. This was 13 percentile for age-, sex, and race-matched controls.  2. Normal coronary origin with left dominance.  3. Severe (70-99) soft plaque stenosis in the mid LAD.  4. Aortic atherosclerosis.  5. Study will be sent for FFR.  RECOMMENDATIONS: CAD-RADS 4: Severe stenosis. (70-99% or > 50% left main). Cardiac catheterization or CT FFR is recommended. Consider symptom-guided anti-ischemic pharmacotherapy as well as risk factor modification per guideline directed care. Invasive coronary angiography recommended with revascularization per published guideline statements.  Olga Millers, MD  Electronically Signed: By: Olga Millers M.D. On: 12/03/2021 15:10      ______________________________________________________________________________________________      Current Reported Medications:.    Current Meds  Medication Sig   aspirin EC 81 MG tablet Take 81 mg by mouth daily. Swallow whole.   CALCIUM-MAGNESIUM-ZINC PO Take 1 tablet by mouth daily.   cholecalciferol (VITAMIN D) 1000 UNITS tablet Take 2,000 Units by mouth 2 (two) times daily.    cyclobenzaprine (FLEXERIL) 10 MG tablet Take 10 mg by mouth 3 (three) times daily as needed for muscle spasms.   diazepam (VALIUM) 10 MG tablet Take 10 mg by mouth every 6 (six) hours as needed for anxiety.   Garlic 1000 MG CAPS See admin instructions.   ibuprofen (ADVIL) 200 MG tablet 1 tablet with food or milk as needed   latanoprost (XALATAN) 0.005 % ophthalmic solution Place 1 drop into both eyes at bedtime.    Multiple Vitamin (MULTIVITAMIN) tablet Take 1 tablet by mouth daily.   pantoprazole (PROTONIX) 40 MG tablet TAKE 1 TABLET BY MOUTH TWICE DAILY BEFORE A MEAL   Polyethyl Glycol-Propyl Glycol (SYSTANE) 0.4-0.3 % SOLN Apply to eye as needed.   polyethylene glycol (MIRALAX / GLYCOLAX) packet Take 17 g by mouth daily.    Probiotic Product (PROBIOTIC ADVANCED PO) Take 1 capsule by mouth daily.   rOPINIRole (REQUIP) 0.5 MG tablet SMARTSIG:1 Tablet(s) By Mouth Every Evening   rosuvastatin (CRESTOR) 10 MG tablet Take 1 tablet (10 mg total) by mouth daily.   Physical Exam:    VS:  BP 134/84  Pulse 62   Ht 5' (1.524 m)   Wt 131 lb 9.6 oz (59.7 kg)   SpO2 98%   BMI 25.70 kg/m    Wt Readings from Last 3 Encounters:  08/31/23 131 lb 9.6 oz (59.7 kg)  03/01/23 129 lb 6.4 oz (58.7 kg)  11/17/22 126 lb 12.8 oz (57.5 kg)    GEN: Well nourished, well developed in no acute distress NECK: No JVD; No carotid bruits CARDIAC: Irregular RR, no murmurs, rubs, gallops RESPIRATORY:  Clear to auscultation without rales, wheezing or rhonchi  ABDOMEN: Soft, non-tender, non-distended EXTREMITIES:  No edema; No  acute deformity     Asessement and Plan:.    CAD/Chest pain: Coronary CTA in June 2023 revealed coronary calcium score of 14.5, 13th percentile for age, sex and race matched controls, severe 79% soft plaque stenosis in the mid LAD, FFR suggested mid LAD lesion was not flow-limiting.  Patient previously declined cath, declined antianginal therapy.  Today she reports that she has been doing well, notes some occasional chest discomfort related to GERD, improved with Tums.  She is regularly gardening and tolerating well.  Reports that she has been having significantly more energy lately.  No indication for ischemic evaluation at this time.  Patient will continue to monitor for symptoms.  Reviewed ED precautions.  Hypertension: Blood pressure today 134/84. Continue current antihypertensive regimen.  Hyperlipidemia: Last lipid profile on 02/25/2023 indicated total cholesterol 169, HDL 90, triglycerides 112 and LDL 60.  On chart review patient was taking rosuvastatin 40 mg daily at that time.  Has been working with [employer].D. as she started having myalgias and rosuvastatin was decreased.  Will check fasting lipid profile and LFTs today.  Palpitations:  Cardiac monitor indicated an average heart rate of 65 bpm, predominant underlying rhythm was sinus rhythm, she had 3 runs of supraventricular tachycardia, run with the fastest interval lasting 9 beats with a max rate of 146 bpm, longest lasting 9 beats with an average rate of 119 bpm.  Triggers were associated with SVT.  Isolated PACs were occasional at 3.8% burden.  Patient declined any medication management.  Patient notes occasional PVCs, notes these are not bothersome to her.  She continues to monitor her heart rate and rhythm with her Apple Watch, notes that she had 2 different episodes of heart rates in the 40s at night when resting.  She did get up and walk around with improvement in heart rates, denied any symptoms.  She will continue to monitor her Apple  Watch and notify if she starts to have any symptoms.    Disposition: F/u with Reather Littler, NP or Dr. Jens Som in six months or sooner if needed.   Signed, Rip Harbour, NP

## 2023-08-31 ENCOUNTER — Encounter: Payer: Self-pay | Admitting: Cardiology

## 2023-08-31 ENCOUNTER — Ambulatory Visit: Payer: Medicare Other | Attending: Cardiology | Admitting: Cardiology

## 2023-08-31 VITALS — BP 134/84 | HR 62 | Ht 60.0 in | Wt 131.6 lb

## 2023-08-31 DIAGNOSIS — I10 Essential (primary) hypertension: Secondary | ICD-10-CM | POA: Diagnosis not present

## 2023-08-31 DIAGNOSIS — I2510 Atherosclerotic heart disease of native coronary artery without angina pectoris: Secondary | ICD-10-CM

## 2023-08-31 DIAGNOSIS — R002 Palpitations: Secondary | ICD-10-CM | POA: Diagnosis not present

## 2023-08-31 DIAGNOSIS — E785 Hyperlipidemia, unspecified: Secondary | ICD-10-CM

## 2023-08-31 NOTE — Patient Instructions (Signed)
 Medication Instructions:  No changes *If you need a refill on your cardiac medications before your next appointment, please call your pharmacy*  Lab Work: Today we are going to draw a fasting lipid profile and LFT's If you have labs (blood work) drawn today and your tests are completely normal, you will receive your results only by: MyChart Message (if you have MyChart) OR A paper copy in the mail If you have any lab test that is abnormal or we need to change your treatment, we will call you to review the results.  Testing/Procedures: No testing  Follow-Up: At [facility], you and your health needs are our priority.  As part of our continuing mission to provide you with exceptional heart care, we have created designated Provider Care Teams.  These Care Teams include your primary Cardiologist (physician) and Advanced Practice Providers (APPs -  Physician Assistants and Nurse Practitioners) who all work together to provide you with the care you need, when you need it.  We recommend signing up for the patient portal called "MyChart".  Sign up information is provided on this After Visit Summary.  MyChart is used to connect with patients for Virtual Visits (Telemedicine).  Patients are able to view lab/test results, encounter notes, upcoming appointments, etc.  Non-urgent messages can be sent to your provider as well.   To learn more about what you can do with MyChart, go to [email or web address].    Your next appointment:   6 month(s)  Provider:   Olga Millers, MD  Other Instructions

## 2023-09-01 LAB — HEPATIC FUNCTION PANEL
ALT: 18 IU/L (ref 0–32)
AST: 23 IU/L (ref 0–40)
Albumin: 4.5 g/dL (ref 3.7–4.7)
Alkaline Phosphatase: 62 IU/L (ref 44–121)
Bilirubin Total: 0.9 mg/dL (ref 0.0–1.2)
Bilirubin, Direct: 0.38 mg/dL (ref 0.00–0.40)
Total Protein: 6.6 g/dL (ref 6.0–8.5)

## 2023-09-01 LAB — LIPID PANEL
Chol/HDL Ratio: 1.8 ratio (ref 0.0–4.4)
Cholesterol, Total: 151 mg/dL (ref 100–199)
HDL: 86 mg/dL (ref >39)
LDL Chol Calc (NIH): 49 mg/dL (ref 0–99)
Triglycerides: 86 mg/dL (ref 0–149)
VLDL Cholesterol Cal: 16 mg/dL (ref 5–40)

## 2023-10-06 ENCOUNTER — Telehealth: Payer: Self-pay | Admitting: Cardiology

## 2023-10-06 NOTE — Telephone Encounter (Signed)
 Spoke with patient and she states she has been have muscle pain and her bilirubin went. She states she read that crestor  can cause muscle pain and damage her liver. She states she also read it cause confusion.   She states she will be 61 and she does not want to take any medications unless absolutely necessary. She states she dont think its helping ans causing more bad than good because she has 3 side effects from the crestor .  She is trying to stop as many prescription medications as possible.  She would like to know what you think

## 2023-10-06 NOTE — Telephone Encounter (Signed)
 Pt c/o medication issue:  1. Name of Medication:   rosuvastatin  (CRESTOR ) 10 MG tablet     2. How are you currently taking this medication (dosage and times per day)? As written   3. Are you having a reaction (difficulty breathing--STAT)? No   4. What is your medication issue? Pt called in stating she has joint pain since starting this medication and her bilirubin went up. She states she looked up the side effects and they are consistent with what she has been experiencing. She asked for other options. Please advise.

## 2023-10-07 NOTE — Telephone Encounter (Signed)
 Spoke with pt, Aware of dr Ludwig Clarks recommendations.

## 2023-10-27 DIAGNOSIS — R7303 Prediabetes: Secondary | ICD-10-CM | POA: Diagnosis not present

## 2023-10-27 DIAGNOSIS — E7800 Pure hypercholesterolemia, unspecified: Secondary | ICD-10-CM | POA: Diagnosis not present

## 2023-10-27 DIAGNOSIS — G2581 Restless legs syndrome: Secondary | ICD-10-CM | POA: Diagnosis not present

## 2023-10-27 DIAGNOSIS — D696 Thrombocytopenia, unspecified: Secondary | ICD-10-CM | POA: Diagnosis not present

## 2023-11-02 DIAGNOSIS — F3341 Major depressive disorder, recurrent, in partial remission: Secondary | ICD-10-CM | POA: Diagnosis not present

## 2023-11-02 DIAGNOSIS — K219 Gastro-esophageal reflux disease without esophagitis: Secondary | ICD-10-CM | POA: Diagnosis not present

## 2023-12-08 DIAGNOSIS — H04123 Dry eye syndrome of bilateral lacrimal glands: Secondary | ICD-10-CM | POA: Diagnosis not present

## 2023-12-08 DIAGNOSIS — H401232 Low-tension glaucoma, bilateral, moderate stage: Secondary | ICD-10-CM | POA: Diagnosis not present

## 2023-12-08 DIAGNOSIS — H26493 Other secondary cataract, bilateral: Secondary | ICD-10-CM | POA: Diagnosis not present

## 2023-12-08 DIAGNOSIS — H43813 Vitreous degeneration, bilateral: Secondary | ICD-10-CM | POA: Diagnosis not present

## 2023-12-29 DIAGNOSIS — M545 Low back pain, unspecified: Secondary | ICD-10-CM | POA: Diagnosis not present

## 2023-12-29 DIAGNOSIS — M25572 Pain in left ankle and joints of left foot: Secondary | ICD-10-CM | POA: Diagnosis not present

## 2024-01-08 ENCOUNTER — Emergency Department (HOSPITAL_COMMUNITY)

## 2024-01-08 ENCOUNTER — Other Ambulatory Visit: Payer: Self-pay

## 2024-01-08 ENCOUNTER — Emergency Department (HOSPITAL_COMMUNITY): Admission: EM | Admit: 2024-01-08 | Discharge: 2024-01-08 | Disposition: A | Discharge status: Home / Self Care

## 2024-01-08 DIAGNOSIS — R531 Weakness: Secondary | ICD-10-CM | POA: Diagnosis not present

## 2024-01-08 DIAGNOSIS — R42 Dizziness and giddiness: Secondary | ICD-10-CM | POA: Insufficient documentation

## 2024-01-08 DIAGNOSIS — R29818 Other symptoms and signs involving the nervous system: Secondary | ICD-10-CM | POA: Diagnosis not present

## 2024-01-08 DIAGNOSIS — R079 Chest pain, unspecified: Secondary | ICD-10-CM | POA: Diagnosis not present

## 2024-01-08 DIAGNOSIS — Z7982 Long term (current) use of aspirin: Secondary | ICD-10-CM | POA: Diagnosis not present

## 2024-01-08 DIAGNOSIS — R519 Headache, unspecified: Secondary | ICD-10-CM | POA: Diagnosis not present

## 2024-01-08 DIAGNOSIS — I10 Essential (primary) hypertension: Secondary | ICD-10-CM | POA: Diagnosis not present

## 2024-01-08 DIAGNOSIS — R0789 Other chest pain: Secondary | ICD-10-CM | POA: Diagnosis not present

## 2024-01-08 LAB — CBC
HCT: 43.9 % (ref 36.0–46.0)
Hemoglobin: 14.7 g/dL (ref 12.0–15.0)
MCH: 32.2 pg (ref 26.0–34.0)
MCHC: 33.5 g/dL (ref 30.0–36.0)
MCV: 96.3 fL (ref 80.0–100.0)
Platelets: 133 K/uL — ABNORMAL LOW (ref 150–400)
RBC: 4.56 MIL/uL (ref 3.87–5.11)
RDW: 12.3 % (ref 11.5–15.5)
WBC: 4.7 K/uL (ref 4.0–10.5)
nRBC: 0 % (ref 0.0–0.2)

## 2024-01-08 LAB — BASIC METABOLIC PANEL WITH GFR
Anion gap: 9 (ref 5–15)
BUN: 14 mg/dL (ref 8–23)
CO2: 24 mmol/L (ref 22–32)
Calcium: 9.4 mg/dL (ref 8.9–10.3)
Chloride: 108 mmol/L (ref 98–111)
Creatinine, Ser: 0.85 mg/dL (ref 0.44–1.00)
GFR, Estimated: >60 mL/min (ref >60)
Glucose, Bld: 125 mg/dL — ABNORMAL HIGH (ref 70–99)
Potassium: 4.1 mmol/L (ref 3.5–5.1)
Sodium: 141 mmol/L (ref 135–145)

## 2024-01-08 LAB — TROPONIN I (HIGH SENSITIVITY)
Troponin I (High Sensitivity): 5 ng/L (ref <18)
Troponin I (High Sensitivity): 5 ng/L (ref <18)

## 2024-01-08 NOTE — ED Triage Notes (Signed)
 Pt complains of chest pain for a few days. This morning at 3am felt like someone was sitting on chest. Denies SOB.

## 2024-01-08 NOTE — ED Provider Notes (Signed)
 [place] [facility] AT [facility] Provider Note   CSN: [id number] Arrival date & time: 01/08/24  1523     Patient presents with: No chief complaint on file.   Holly Conley is a 88 y.o. female.   Patient to ED with central chest pressure since this morning around 3:00 am. She reports some lightheadedness and feeling off balance with walking. History of vertigo which she has had for the past 4 days, better today remarking that the lightheadedness is dissimilar to her symptoms of vertigo. No cough or fever. No vomiting but she has had some mild nausea. She is followed by Dr. Pietro, cardiology, for nonobstructive disease. No cardiac stents in the past.   The history is provided by the patient and a relative. No language interpreter was used.       Prior to Admission medications   Medication Sig Start Date End Date Taking? Authorizing Provider  aspirin EC 81 MG tablet Take 81 mg by mouth daily. Swallow whole.    [provider]  calcium  carbonate (TUMS EX) 750 MG chewable tablet Chew 1 tablet by mouth as needed. Patient not taking: Reported on 03/01/2023    [provider]  CALCIUM -MAGNESIUM-ZINC PO Take 1 tablet by mouth daily.    [provider]  cholecalciferol (VITAMIN D) 1000 UNITS tablet Take 2,000 Units by mouth 2 (two) times daily.     [provider]  cyclobenzaprine (FLEXERIL) 10 MG tablet Take 10 mg by mouth 3 (three) times daily as needed for muscle spasms.    [provider]  diazepam (VALIUM) 10 MG tablet Take 10 mg by mouth every 6 (six) hours as needed for anxiety.    [provider]  Garlic 1000 MG CAPS See admin instructions.    [provider]  ibuprofen (ADVIL) 200 MG tablet 1 tablet with food or milk as needed    [provider]  latanoprost (XALATAN) 0.005 % ophthalmic solution Place 1 drop into both eyes at bedtime.  03/17/18   [provider]  Multiple Vitamin  (MULTIVITAMIN) tablet Take 1 tablet by mouth daily.    [provider]  nitroGLYCERIN  (NITROSTAT ) 0.4 MG SL tablet Place 1 tablet (0.4 mg total) under the tongue every 5 (five) minutes as needed for chest pain. Patient not taking: Reported on 09/21/2022 01/11/22 09/21/22  Pietro Redell RAMAN, MD  pantoprazole  (PROTONIX ) 40 MG tablet TAKE 1 TABLET BY MOUTH TWICE DAILY BEFORE A MEAL 04/07/23   Dick, Ernest H Jr., NP  Polyethyl Glycol-Propyl Glycol (SYSTANE) 0.4-0.3 % SOLN Apply to eye as needed.    [provider]  polyethylene glycol (MIRALAX / GLYCOLAX) packet Take 17 g by mouth daily.     [provider]  Probiotic Product (PROBIOTIC ADVANCED PO) Take 1 capsule by mouth daily.    [provider]  rOPINIRole (REQUIP) 0.5 MG tablet SMARTSIG:1 Tablet(s) By Mouth Every Evening 07/14/23   [provider]    Allergies: Codeine, Morphine and codeine, and Penicillins    Review of Systems  Updated Vital Signs BP (!) 154/83   Pulse 62   Temp 98.3 F (36.8 C)   Resp 18   SpO2 99%   Physical Exam Vitals and nursing note reviewed.  Constitutional:      Appearance: She is well-developed.  HENT:     Head: Normocephalic.     Mouth/Throat:     Mouth: Mucous membranes are moist.  Cardiovascular:     Rate and  Rhythm: Normal rate and regular rhythm.     Heart sounds: No murmur heard. Pulmonary:     Effort: Pulmonary effort is normal.     Breath sounds: Normal breath sounds. No wheezing, rhonchi or rales.  Abdominal:     Palpations: Abdomen is soft.     Tenderness: There is no abdominal tenderness. There is no guarding or rebound.  Musculoskeletal:        General: Normal range of motion.     Cervical back: Normal range of motion and neck supple.     Right lower leg: No edema.     Left lower leg: No edema.  Skin:    General: Skin is warm and dry.  Neurological:     General: No focal deficit present.     Mental Status: She is alert and oriented to  person, place, and time.     (all labs ordered are listed, but only abnormal results are displayed) Labs Reviewed  BASIC METABOLIC PANEL WITH GFR - Abnormal; Notable for the following components:      Result Value   Glucose, Bld 125 (*)    All other components within normal limits  CBC - Abnormal; Notable for the following components:   Platelets 133 (*)    All other components within normal limits  TROPONIN I (HIGH SENSITIVITY)  TROPONIN I (HIGH SENSITIVITY)    EKG: EKG Interpretation Date/Time:  Sunday January 08 2024 15:36:34 EDT Ventricular Rate:  64 PR Interval:  150 QRS Duration:  78 QT Interval:  408 QTC Calculation: 420 R Axis:   27  Text Interpretation: Sinus rhythm with Premature atrial complexes T wave abnormality, consider inferior ischemia Abnormal ECG When compared with ECG of 31-Aug-2023 14:11, PREVIOUS ECG IS PRESENT Confirmed by Neysa Clap [phone]) on 01/08/2024 4:54:28 PM  Radiology: CT Head Wo Contrast Result Date: 01/08/2024 EXAM: CT HEAD WITHOUT 01/08/2024 08:03:06 PM TECHNIQUE: CT of the head was performed without the administration of intravenous contrast. Automated exposure control, iterative reconstruction, and/or weight based adjustment of the mA/kV was utilized to reduce the radiation dose to as low as reasonably achievable. COMPARISON: 01/20/2019 CLINICAL HISTORY: Headache, neuro deficit. FINDINGS: BRAIN AND VENTRICLES: No acute intracranial hemorrhage. No mass effect or midline shift. No extra-axial fluid collection. Gray-white differentiation is maintained. No hydrocephalus. Chronic ischemic white matter changes. ORBITS: No acute abnormality. SINUSES AND MASTOIDS: No acute abnormality. SOFT TISSUES AND SKULL: No acute skull fracture. No acute soft tissue abnormality. IMPRESSION: 1. No acute intracranial abnormality. 2. Chronic ischemic white matter changes. Electronically signed by: Franky Stanford MD 01/08/2024 08:10 PM EDT RP Workstation: [id number]   DG  Chest 2 View Result Date: 01/08/2024 EXAM: 2 VIEW(S) XRAY OF THE CHEST 01/08/2024 04:13:00 PM COMPARISON: PA and lateral radiographs of the chest dated 04/13/2018. CLINICAL HISTORY: Chest pain. Patient notes recent bout of vertigo with weakness, lightheadedness. Xray ordered for chest pain. Per triage notes: Pt complains of chest pain for a few days. This morning at 3am felt like someone was sitting on chest. Denies SOB. FINDINGS: LUNGS AND PLEURA: No focal pulmonary opacity. No pulmonary edema. No pleural effusion. No pneumothorax. HEART AND MEDIASTINUM: No acute abnormality of the cardiac and mediastinal silhouettes. BONES AND SOFT TISSUES: No acute osseous abnormality. IMPRESSION: 1. No acute cardiopulmonary pathology. Electronically signed by: evalene coho 01/08/2024 04:21 PM EDT RP Workstation: [id number]     Procedures   Medications Ordered in the ED - No data to display  Medical Decision Making This patient presents to the ED for concern of chest pain, this involves an extensive number of treatment options, and is a complaint that carries with it a high risk of complications and morbidity.  The differential diagnosis includes ACS, dissection, PTX, PE, PNA, GERD, PUD   Co morbidities that complicate the patient evaluation  GERD, HLD, HTN, glaucoma, arthritis, dizziness, anxiety   Additional history obtained:  Additional history and/or information obtained from chart review, notable for No recent emergency medical evaluations   Lab Tests:  I Ordered, and personally interpreted labs.  The pertinent results include:   Results for orders placed or performed during the hospital encounter of 01/08/24 -Basic metabolic panel:  Collection Time: 01/08/24  3:43 PM      Result                      Value             Ref Range          Sodium                      141               135 - 145 mm*      Potassium                   4.1               3.5 - 5.1  mm*      Chloride                    108               98 - 111 mmo*      CO2                         24                22 - 32 mmol*      Glucose, Bld                125 (H)           70 - 99 mg/dL      BUN                         14                8 - 23 mg/dL       Creatinine, Ser             0.85              0.44 - 1.00 *      Calcium                      9.4               8.9 - 10.3 m*      GFR, Estimated              >60               >60 mL/min         Anion gap                   9  5 - 15        -CBC:  Collection Time: 01/08/24  3:43 PM      Result                      Value             Ref Range          WBC                         4.7               4.0 - 10.5 K*      RBC                         4.56              3.87 - 5.11 *      Hemoglobin                  14.7              12.0 - 15.0 *      HCT                         43.9              36.0 - 46.0 %      MCV                         96.3              80.0 - 100.0*      MCH                         32.2              26.0 - 34.0 *      MCHC                        33.5              30.0 - 36.0 *      RDW                         12.3              11.5 - 15.5 %      Platelets                   133 (L)           150 - 400 K/*      nRBC                        0.0               0.0 - 0.2 %   -Troponin I (High Sensitivity):  Collection Time: 01/08/24  3:43 PM      Result                      Value             Ref Range          Troponin I (High Sensi*  5                 <18 ng/L      -Troponin I (High Sensitivity):  Collection Time: 01/08/24  5:43 PM      Result                      Value             Ref Range          Troponin I (High Sensi*     5                 <18 ng/L          Imaging Studies ordered:  I ordered imaging studies including CXR I independently visualized and interpreted imaging which showed NAD I agree with the radiologist interpretation CT head:   IMPRESSION: 1. No acute intracranial  abnormality. 2. Chronic ischemic white matter changes.    Cardiac Monitoring:  The patient was maintained on a cardiac monitor.  I personally viewed and interpreted the cardiac monitored which showed an underlying rhythm of:  EKG Interpretation Date/Time:  Sunday January 08 2024 15:36:34 EDT Ventricular Rate:  64 PR Interval:  150 QRS Duration:  78 QT Interval:  408 QTC Calculation: 420 R Axis:   27  Text Interpretation: Sinus rhythm with Premature atrial complexes T wave abnormality, consider inferior ischemia Abnormal ECG When compared with ECG of 31-Aug-2023 14:11, PREVIOUS ECG IS PRESENT Confirmed by Neysa Clap [phone]) on 01/08/2024 4:54:28 PM    Test Considered:  N/a   Critical Interventions:  N/a   Consultations Obtained:  I requested consultation with the n/a,  and discussed lab and imaging findings as well as pertinent plan - they recommend: n/a   Problem List / ED Course:  Here with chest pain like pressure in her chest Also reports dizziness. H/O vertigo but this has been 4 days and that is not her typical No syncope, fever, cough  Labs, imaging, EKG all unremarkable Patient reassured She is stable for discharge and can follow up with her doctor for recheck this week.    Reevaluation:  After the interventions noted above, I reevaluated the patient and found that they have :stayed the same   Social Determinants of Health:  Never a smoker   Disposition:  After consideration of the diagnostic results and the patients response to treatment, I feel that the patient would benefit from discharge home.   Amount and/or Complexity of Data Reviewed Labs: ordered. Radiology: ordered.        Final diagnoses:  Nonspecific chest pain  Dizziness    ED Discharge Orders     None          Odell Balls, PA-C 01/08/24 2030    Neysa Clap PARAS, DO 01/09/24 0002

## 2024-01-08 NOTE — Discharge Instructions (Signed)
 As we discussed, your tests tonight are all very reassuring and do not show any serious condition. Please follow up with your doctor for recheck if symptoms persist.

## 2024-01-11 ENCOUNTER — Telehealth: Payer: Self-pay | Admitting: Cardiology

## 2024-01-11 NOTE — Telephone Encounter (Signed)
 Spoke with pt, appointment details and follow up discussed. She will wait until closer to the appointment to cancel if she does not feel she needs to come.

## 2024-01-11 NOTE — Telephone Encounter (Signed)
 Pt called in asking if she should keep f/u 9/11 with Dr. Pietro since she was recently in the ED and they ran a lot of tests on her. Please advise.

## 2024-01-18 DIAGNOSIS — K080 Exfoliation of teeth due to systemic causes: Secondary | ICD-10-CM | POA: Diagnosis not present

## 2024-02-16 ENCOUNTER — Ambulatory Visit: Admitting: Cardiology

## 2024-03-01 ENCOUNTER — Other Ambulatory Visit: Payer: Self-pay

## 2024-04-06 DIAGNOSIS — H6993 Unspecified Eustachian tube disorder, bilateral: Secondary | ICD-10-CM | POA: Diagnosis not present

## 2024-04-06 DIAGNOSIS — R42 Dizziness and giddiness: Secondary | ICD-10-CM | POA: Diagnosis not present

## 2024-04-12 DIAGNOSIS — L578 Other skin changes due to chronic exposure to nonionizing radiation: Secondary | ICD-10-CM | POA: Diagnosis not present

## 2024-04-12 DIAGNOSIS — D692 Other nonthrombocytopenic purpura: Secondary | ICD-10-CM | POA: Diagnosis not present

## 2024-04-12 DIAGNOSIS — L304 Erythema intertrigo: Secondary | ICD-10-CM | POA: Diagnosis not present

## 2024-04-12 DIAGNOSIS — D485 Neoplasm of uncertain behavior of skin: Secondary | ICD-10-CM | POA: Diagnosis not present

## 2024-04-12 DIAGNOSIS — L570 Actinic keratosis: Secondary | ICD-10-CM | POA: Diagnosis not present

## 2024-04-12 DIAGNOSIS — D2230 Melanocytic nevi of unspecified part of face: Secondary | ICD-10-CM | POA: Diagnosis not present

## 2024-04-26 DIAGNOSIS — I10 Essential (primary) hypertension: Secondary | ICD-10-CM | POA: Diagnosis not present

## 2024-04-26 DIAGNOSIS — G2581 Restless legs syndrome: Secondary | ICD-10-CM | POA: Diagnosis not present

## 2024-04-26 DIAGNOSIS — D696 Thrombocytopenia, unspecified: Secondary | ICD-10-CM | POA: Diagnosis not present

## 2024-04-26 DIAGNOSIS — E559 Vitamin D deficiency, unspecified: Secondary | ICD-10-CM | POA: Diagnosis not present

## 2024-04-26 DIAGNOSIS — R7303 Prediabetes: Secondary | ICD-10-CM | POA: Diagnosis not present

## 2024-04-26 DIAGNOSIS — Z0000 Encounter for general adult medical examination without abnormal findings: Secondary | ICD-10-CM | POA: Diagnosis not present

## 2024-04-26 DIAGNOSIS — E7800 Pure hypercholesterolemia, unspecified: Secondary | ICD-10-CM | POA: Diagnosis not present

## 2024-04-26 DIAGNOSIS — R42 Dizziness and giddiness: Secondary | ICD-10-CM | POA: Diagnosis not present

## 2024-05-10 ENCOUNTER — Inpatient Hospital Stay
Admission: RE | Admit: 2024-05-10 | Discharge: 2024-05-10 | Payer: Medicare Other | Attending: Family Medicine | Admitting: Family Medicine

## 2024-05-10 DIAGNOSIS — Z1231 Encounter for screening mammogram for malignant neoplasm of breast: Secondary | ICD-10-CM | POA: Diagnosis not present

## 2024-05-14 ENCOUNTER — Other Ambulatory Visit: Payer: Self-pay | Admitting: Family Medicine

## 2024-05-14 DIAGNOSIS — Z1231 Encounter for screening mammogram for malignant neoplasm of breast: Secondary | ICD-10-CM

## 2024-05-16 ENCOUNTER — Ambulatory Visit: Admitting: Podiatry

## 2024-05-16 ENCOUNTER — Encounter: Payer: Self-pay | Admitting: Podiatry

## 2024-05-16 DIAGNOSIS — B351 Tinea unguium: Secondary | ICD-10-CM | POA: Diagnosis not present

## 2024-05-16 DIAGNOSIS — M79674 Pain in right toe(s): Secondary | ICD-10-CM

## 2024-05-16 DIAGNOSIS — M79675 Pain in left toe(s): Secondary | ICD-10-CM | POA: Diagnosis not present

## 2024-05-16 NOTE — Progress Notes (Signed)
This patient presents to the office with chief complaint of long thick painful nails.  Patient says the nails are painful walking and wearing shoes.  This patient is unable to self treat.  This patient is unable to trim her nails since she is unable to reach her nails.  She presents to the office for preventative foot care services. ? ?General Appearance  Alert, conversant and in no acute stress. ? ?Vascular  Dorsalis pedis and posterior tibial  pulses are palpable  bilaterally.  Capillary return is within normal limits  bilaterally. Temperature is within normal limits  bilaterally. ? ?Neurologic  Senn-Weinstein monofilament wire test within normal limits  bilaterally. Muscle power within normal limits bilaterally. ? ?Nails Thick disfigured discolored nails with subungual debris  hallux nails  bilaterally. No evidence of bacterial infection or drainage bilaterally. ? ?Orthopedic  No limitations of motion  feet .  No crepitus or effusions noted.  No bony pathology or digital deformities noted. ? ?Skin  normotropic skin with no porokeratosis noted bilaterally.  No signs of infections or ulcers noted.    ? ?Onychomycosis  Nails  B/L.  Pain in right toes  Pain in left toes ? ?Debridement of nails both feet followed trimming the nails with dremel tool.   RTC 3 months. ? ? ?Nakia Koble DPM   ?

## 2024-05-18 ENCOUNTER — Other Ambulatory Visit: Payer: Self-pay | Admitting: Family Medicine

## 2024-05-18 DIAGNOSIS — Z1231 Encounter for screening mammogram for malignant neoplasm of breast: Secondary | ICD-10-CM

## 2024-07-19 ENCOUNTER — Ambulatory Visit

## 2024-07-27 ENCOUNTER — Ambulatory Visit: Admitting: Cardiology

## 2024-08-08 ENCOUNTER — Ambulatory Visit: Admitting: Podiatry

## 2024-08-23 ENCOUNTER — Ambulatory Visit: Admitting: Cardiology

## 2024-10-04 ENCOUNTER — Ambulatory Visit: Admitting: Cardiology

## 2025-05-13 ENCOUNTER — Ambulatory Visit
